# Patient Record
Sex: Female | Born: 1985 | Race: Black or African American | Hispanic: No | Marital: Single | Smoking: Never smoker
Health system: Southern US, Community
[De-identification: ages and names within clinical notes are randomized; demographics above are authoritative.]

## PROBLEM LIST (undated history)

## (undated) ENCOUNTER — Inpatient Hospital Stay (HOSPITAL_COMMUNITY): Payer: Self-pay

## (undated) DIAGNOSIS — O169 Unspecified maternal hypertension, unspecified trimester: Secondary | ICD-10-CM

## (undated) DIAGNOSIS — B019 Varicella without complication: Secondary | ICD-10-CM

## (undated) DIAGNOSIS — R87629 Unspecified abnormal cytological findings in specimens from vagina: Secondary | ICD-10-CM

## (undated) DIAGNOSIS — Z87440 Personal history of urinary (tract) infections: Secondary | ICD-10-CM

## (undated) DIAGNOSIS — N879 Dysplasia of cervix uteri, unspecified: Secondary | ICD-10-CM

## (undated) HISTORY — DX: Unspecified abnormal cytological findings in specimens from vagina: R87.629

## (undated) HISTORY — DX: Dysplasia of cervix uteri, unspecified: N87.9

## (undated) HISTORY — PX: OTHER SURGICAL HISTORY: SHX169

## (undated) HISTORY — DX: Personal history of urinary (tract) infections: Z87.440

## (undated) HISTORY — DX: Unspecified maternal hypertension, unspecified trimester: O16.9

## (undated) HISTORY — DX: Varicella without complication: B01.9

---

## 2011-09-15 DIAGNOSIS — N879 Dysplasia of cervix uteri, unspecified: Secondary | ICD-10-CM

## 2011-09-15 HISTORY — DX: Dysplasia of cervix uteri, unspecified: N87.9

## 2016-11-11 ENCOUNTER — Ambulatory Visit (INDEPENDENT_AMBULATORY_CARE_PROVIDER_SITE_OTHER): Payer: 59 | Admitting: Family Medicine

## 2016-11-11 ENCOUNTER — Encounter: Payer: Self-pay | Admitting: Family Medicine

## 2016-11-11 ENCOUNTER — Ambulatory Visit: Payer: Self-pay | Admitting: Family Medicine

## 2016-11-11 VITALS — BP 112/82 | HR 81 | Temp 98.1°F | Ht 62.0 in | Wt 176.8 lb

## 2016-11-11 DIAGNOSIS — Z131 Encounter for screening for diabetes mellitus: Secondary | ICD-10-CM | POA: Diagnosis not present

## 2016-11-11 DIAGNOSIS — Z86018 Personal history of other benign neoplasm: Secondary | ICD-10-CM

## 2016-11-11 DIAGNOSIS — R8290 Unspecified abnormal findings in urine: Secondary | ICD-10-CM

## 2016-11-11 DIAGNOSIS — N920 Excessive and frequent menstruation with regular cycle: Secondary | ICD-10-CM | POA: Diagnosis not present

## 2016-11-11 DIAGNOSIS — Z862 Personal history of diseases of the blood and blood-forming organs and certain disorders involving the immune mechanism: Secondary | ICD-10-CM

## 2016-11-11 DIAGNOSIS — Z13220 Encounter for screening for lipoid disorders: Secondary | ICD-10-CM

## 2016-11-11 DIAGNOSIS — N3000 Acute cystitis without hematuria: Secondary | ICD-10-CM | POA: Diagnosis not present

## 2016-11-11 DIAGNOSIS — D259 Leiomyoma of uterus, unspecified: Secondary | ICD-10-CM | POA: Insufficient documentation

## 2016-11-11 DIAGNOSIS — Z1329 Encounter for screening for other suspected endocrine disorder: Secondary | ICD-10-CM

## 2016-11-11 LAB — COMPREHENSIVE METABOLIC PANEL
ALT: 8 U/L (ref 0–35)
AST: 14 U/L (ref 0–37)
Albumin: 4.1 g/dL (ref 3.5–5.2)
Alkaline Phosphatase: 69 U/L (ref 39–117)
BUN: 9 mg/dL (ref 6–23)
CHLORIDE: 105 meq/L (ref 96–112)
CO2: 25 mEq/L (ref 19–32)
Calcium: 9 mg/dL (ref 8.4–10.5)
Creatinine, Ser: 0.8 mg/dL (ref 0.40–1.20)
GFR: 109.32 mL/min (ref >60.00)
GLUCOSE: 106 mg/dL — AB (ref 70–99)
POTASSIUM: 3.8 meq/L (ref 3.5–5.1)
SODIUM: 136 meq/L (ref 135–145)
Total Bilirubin: 0.4 mg/dL (ref 0.2–1.2)
Total Protein: 7.9 g/dL (ref 6.0–8.3)

## 2016-11-11 LAB — LIPID PANEL
Cholesterol: 185 mg/dL (ref 0–200)
HDL: 38.2 mg/dL — AB (ref >39.00)
LDL CALC: 129 mg/dL — AB (ref 0–99)
NONHDL: 147.18
Total CHOL/HDL Ratio: 5
Triglycerides: 92 mg/dL (ref 0.0–149.0)
VLDL: 18.4 mg/dL (ref 0.0–40.0)

## 2016-11-11 LAB — HEMOGLOBIN A1C: Hgb A1c MFr Bld: 6.3 % (ref 4.6–6.5)

## 2016-11-11 LAB — CBC
HEMATOCRIT: 34.5 % — AB (ref 36.0–46.0)
Hemoglobin: 10.9 g/dL — ABNORMAL LOW (ref 12.0–15.0)
MCHC: 31.5 g/dL (ref 30.0–36.0)
MCV: 85.9 fl (ref 78.0–100.0)
Platelets: 420 10*3/uL — ABNORMAL HIGH (ref 150.0–400.0)
RBC: 4.01 Mil/uL (ref 3.87–5.11)
RDW: 15 % (ref 11.5–15.5)
WBC: 5.5 10*3/uL (ref 4.0–10.5)

## 2016-11-11 LAB — FERRITIN: Ferritin: 5.3 ng/mL — ABNORMAL LOW (ref 10.0–291.0)

## 2016-11-11 LAB — TSH: TSH: 0.65 u[IU]/mL (ref 0.35–4.50)

## 2016-11-11 NOTE — Patient Instructions (Signed)
It was very nice to meet you today- welcome to [place]!   I am going to set you up with OB-GYN to establish care and further discuss your fibroids. In the meantime continue consistent condom use and remember Plan B is available OTC if needed We will check your labs today - cholesterol, etc- and will check on your iron level.  If your iron level is still low it might be smart to consider a hormonal contraceptive such as the pill, patch, ring, or IUD to decrease your menstrual bleeding I'll be in touch with your labs asap- take care!

## 2016-11-11 NOTE — Progress Notes (Addendum)
[place] at [facility] [address] [place], [place] [address] [phone]  Date:  11/11/2016   Name:  Nicole Kaiser   DOB:  28-Aug-1986   MRN:  [id number]  PCP:  Lamar Blinks, MD    Chief Complaint: Establish Care (Pt here to est. Pt had high risk pregnancy, had son last year in June. Pregnancy was high risk due to fibroids. Pt states that she has not been seen since having baby. Pt was not seen due to moving from [place] to [place]. Pt wonders if fibroids could have possible gotten smaller or if she should checked. Only has lower abd discomfort. c/o recurrent cyst in groin area that is small right now but will sometimes drain pus. )   History of Present Illness:  Lochlyn Kaiser is a 31 y.o. very pleasant female patient who presents with the following:  Here as a new patient today.  No outside records to review as of yet. She has moved here from [place]- just a few months ago. She moved to [place] to be closer to her family  Her first born- a son- was born this past June- he is doing very well  She was dx with fibroids in her uterus on [place] during her pregnancy She did have an emergency C section and her BP was high following delivery.  She thinks that she was dx with pre-eclampsia and had to be kept inpt for several days after devliery. She is no longer on any BP medicattion She is not sure if she will eventually want another baby or not.  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  She does not have any others like this.    She has had frequent UTI in the past, and notes that she tends to think about these sx a lot. She has noted a change in her urine odor recently and would like to make sure she does not have a UTI currently- we will do a urine culture for her  She works in [employer]  Patient Active Problem List   Diagnosis Date Noted  . History of uterine fibroid 11/11/2016  . History of iron deficiency anemia 11/11/2016  . Menorrhagia with regular cycle 11/11/2016    Past Medical History:  Diagnosis Date  . Chicken pox   . History of UTI   . Hypertension affecting pregnancy in first trimester    and second trimester    Past Surgical History:  Procedure Laterality Date  . CESAREAN SECTION  02/14/2016    Social History  Substance Use Topics  . Smoking status: Never Smoker  . Smokeless tobacco: Never Used  . Alcohol use 0.6 oz/week    1 Glasses  of wine per week    Family History  Problem Relation Age of Onset  . Alcoholism Father   . Diabetes Maternal Grandmother     No Known Allergies  Medication list has been reviewed and updated.  No current outpatient prescriptions on file prior to visit.   No current facility-administered medications on file prior to visit.     Review of Systems: Notes that she does tend to crave ice so she suspect low iron level As per HPI- otherwise negative.   Physical Examination: Vitals:   11/11/16 0828  BP: 112/82  Pulse: 81  Temp: 98.1 F (36.7 C)   Vitals:   11/11/16 0828  Weight: 176 lb 12.8 oz (80.2 kg)  Height: 5\' 2"  (1.575 m)   Body mass index is 32.34 kg/m. Ideal Body Weight: Weight in (lb) to have BMI = 25: 136.4  GEN: WDWN, NAD, Non-toxic, A & O x 3, overweight, otherwise  appears very well and healthy  HEENT: Atraumatic, Normocephalic. Neck supple. No masses, No LAD. Bilateral TM wnl, oropharynx normal.  PEERL,EOMI.   Ears and Nose: No external deformity. CV: RRR, No M/G/R. No JVD. No thrill. No extra heart sounds. PULM: CTA B, no wheezes, crackles, rhonchi. No retractions. No resp. distress. No accessory muscle use. ABD: S, NT, ND. No rebound. No HSM. EXTR: No c/c/e NEURO Normal gait.  PSYCH: Normally interactive. Conversant. Not depressed or anxious appearing.  Calm demeanor.  Normally healing lower abd scar from her c section appears to be healing normally The left groin shows a tiny cyst vs hair follicle that is draining whitish fluid.  It is not tender and does not appear to be infected. Likely aseptic - advised pt to monitor and let me know if any changes or concerns about this area    Assessment and Plan:  History of iron deficiency anemia - Plan: CBC, Ferritin  Abnormal urine odor - Plan: Urine Culture  Screening for hyperlipidemia - Plan: Lipid panel  Screening for thyroid disorder - Plan: TSH  Screening for diabetes mellitus - Plan: Comprehensive metabolic panel, Hemoglobin A1c  History of uterine fibroid - Plan: Ambulatory referral to Obstetrics / Gynecology  Menorrhagia with regular cycle - Plan: Ambulatory referral to Obstetrics / Gynecology   Here today to establish care She declines a flu shot today- I did encourage her to do this Referral to [place] pending as above.   Encouraged consistent contraceptive use for her  See patient instructions for more details.   It was very nice to meet you today- welcome to [place]!   I am going to set you up with OB-GYN to establish care and further discuss your fibroids. In the meantime continue consistent condom use and remember Plan B is available OTC if needed We will check your labs today - cholesterol, etc- and will check on your iron level.  If your iron level is still low it might be smart  to consider a hormonal contraceptive such as the pill, patch, ring, or IUD to decrease your menstrual bleeding I'll be in touch with your labs asap- take care!   Signed Lamar Blinks, MD  Received her labs and gave her a call 3/2- she does have a UTI, will call in macrobid for her for treatment.  She states understanding  Letter to pt with other details   Results for orders placed or performed in visit on 11/11/16  Urine Culture  Result Value Ref Range   Culture ESCHERICHIA COLI  Colony Count Greater than 100,000 CFU/mL    Organism ID, Bacteria ESCHERICHIA COLI       Susceptibility   Escherichia coli -  (no method available)    AMPICILLIN >=32 Resistant     AMOX/CLAVULANIC 8 Sensitive     AMPICILLIN/SULBACTAM 16 Intermediate     PIP/TAZO <=4 Sensitive     IMIPENEM <=0.25 Sensitive     CEFAZOLIN <=4 Not Reportable     CEFTRIAXONE <=1 Sensitive     CEFTAZIDIME <=1 Sensitive     CEFEPIME <=1 Sensitive     GENTAMICIN <=1 Sensitive     TOBRAMYCIN <=1 Sensitive     CIPROFLOXACIN >=4 Resistant     LEVOFLOXACIN >=8 Resistant     NITROFURANTOIN <=16 Sensitive     TRIMETH/SULFA* <=20 Sensitive      * NR=NOT REPORTABLE,SEE COMMENTORAL therapy:A cefazolin MIC of <32 predicts susceptibility to the oral agents cefaclor,cefdinir,cefpodoxime,cefprozil,cefuroxime,cephalexin,and loracarbef when used for therapy of uncomplicated UTIs due to E.coli,K.pneumomiae,and P.mirabilis. PARENTERAL therapy: A cefazolinMIC of >8 indicates resistance to parenteralcefazolin. An alternate test method must beperformed to confirm susceptibility to parenteralcefazolin.  CBC  Result Value Ref Range   WBC 5.5 4.0 - 10.5 K/uL   RBC 4.01 3.87 - 5.11 Mil/uL   Platelets 420.0 (H) 150.0 - 400.0 K/uL   Hemoglobin 10.9 (Nicole) 12.0 - 15.0 g/dL   HCT 34.5 (Nicole) 36.0 - 46.0 %   MCV 85.9 78.0 - 100.0 fl   MCHC 31.5 30.0 - 36.0 g/dL   RDW 15.0 11.5 - 15.5 %  Comprehensive metabolic panel  Result Value Ref Range   Sodium  136 135 - 145 mEq/Nicole   Potassium 3.8 3.5 - 5.1 mEq/Nicole   Chloride 105 96 - 112 mEq/Nicole   CO2 25 19 - 32 mEq/Nicole   Glucose, Bld 106 (H) 70 - 99 mg/dL   BUN 9 6 - 23 mg/dL   Creatinine, Ser 0.80 0.40 - 1.20 mg/dL   Total Bilirubin 0.4 0.2 - 1.2 mg/dL   Alkaline Phosphatase 69 39 - 117 U/Nicole   AST 14 0 - 37 U/Nicole   ALT 8 0 - 35 U/Nicole   Total Protein 7.9 6.0 - 8.3 g/dL   Albumin 4.1 3.5 - 5.2 g/dL   Calcium 9.0 8.4 - 10.5 mg/dL   GFR 109.32 >60.00 mL/min  Lipid panel  Result Value Ref Range   Cholesterol 185 0 - 200 mg/dL   Triglycerides 92.0 0.0 - 149.0 mg/dL   HDL 38.20 (Nicole) >39.00 mg/dL   VLDL 18.4 0.0 - 40.0 mg/dL   LDL Cholesterol 129 (H) 0 - 99 mg/dL   Total CHOL/HDL Ratio 5    NonHDL 147.18   TSH  Result Value Ref Range   TSH 0.65 0.35 - 4.50 uIU/mL  Hemoglobin A1c  Result Value Ref Range   Hgb A1c MFr Bld 6.3 4.6 - 6.5 %  Ferritin  Result Value Ref Range   Ferritin 5.3 (Nicole) 10.0 - 291.0 ng/mL

## 2016-11-13 ENCOUNTER — Encounter: Payer: Self-pay | Admitting: Family Medicine

## 2016-11-13 LAB — URINE CULTURE

## 2016-11-13 MED ORDER — NITROFURANTOIN MONOHYD MACRO 100 MG PO CAPS: 100.0000 mg | ORAL_CAPSULE | Freq: Two times a day (BID) | ORAL | 0 refills | Status: DC

## 2016-11-13 MED FILL — NITROFURANTOIN MONO-MCR 100: 100 | 7 days supply | Qty: 14 | Fill #0

## 2016-11-13 NOTE — Addendum Note (Signed)
Addended by: Lamar Blinks C on: 11/13/2016 12:49 PM   Modules accepted: Orders

## 2016-12-09 ENCOUNTER — Encounter: Payer: Self-pay | Admitting: Obstetrics & Gynecology

## 2016-12-09 ENCOUNTER — Ambulatory Visit (INDEPENDENT_AMBULATORY_CARE_PROVIDER_SITE_OTHER): Payer: 59 | Admitting: Obstetrics & Gynecology

## 2016-12-09 VITALS — BP 90/64 | HR 73 | Ht 61.0 in | Wt 176.0 lb

## 2016-12-09 DIAGNOSIS — N760 Acute vaginitis: Secondary | ICD-10-CM

## 2016-12-09 DIAGNOSIS — N920 Excessive and frequent menstruation with regular cycle: Secondary | ICD-10-CM | POA: Diagnosis not present

## 2016-12-09 DIAGNOSIS — B9689 Other specified bacterial agents as the cause of diseases classified elsewhere: Secondary | ICD-10-CM

## 2016-12-09 DIAGNOSIS — Z86018 Personal history of other benign neoplasm: Secondary | ICD-10-CM

## 2016-12-09 DIAGNOSIS — Z862 Personal history of diseases of the blood and blood-forming organs and certain disorders involving the immune mechanism: Secondary | ICD-10-CM | POA: Diagnosis not present

## 2016-12-09 MED ORDER — TRANEXAMIC ACID 650 MG PO TABS: 1300.0000 mg | ORAL_TABLET | Freq: Three times a day (TID) | ORAL | 2 refills | Status: DC

## 2016-12-09 MED ORDER — NAPROXEN 500 MG PO TBEC: 500.0000 mg | DELAYED_RELEASE_TABLET | Freq: Two times a day (BID) | ORAL | 5 refills | Status: DC

## 2016-12-09 MED ORDER — IRON 325 (65 FE) MG PO TABS: 1.0000 | ORAL_TABLET | Freq: Two times a day (BID) | ORAL | 7 refills | Status: DC

## 2016-12-09 NOTE — Progress Notes (Signed)
GYNECOLOGY OFFICE VISIT NOTE  History:  31 y.o. G1P1 here today for evaluation and discussion of menorrhagia and fibroids.  Reports normal menstrual periods until recent pregnancy. During pregnancy, she was told she had three fibroids.  After delivery in June 2017, her periods resumed and became heavier than usual. Periods are regular, last for 5 days, but the first two days are characterized by heavy bleeding needing pad changes every three hours. No associated cramping.  Denies symptoms of anemia but has been diagnosed with iron deficiency anemia for which she takes oral iron therapy.  She established care with Dr. Edilia Bo (PCP) on 11/11/16 and was referred here for further evaluation . She denies any current abnormal vaginal discharge, pelvic pain or other concerns.   Past Medical History:  Diagnosis Date  . Cervical dysplasia 2013   Normal follow up paps  . Chicken pox   . History of UTI   . Hypertension affecting pregnancy     Past Surgical History:  Procedure Laterality Date  . CESAREAN SECTION  02/14/2016    The following portions of the patient's history were reviewed and updated as appropriate: allergies, current medications, past family history, past medical history, past social history, past surgical history and problem list.   Health Maintenance:  Normal pap in 2016 in [place].   Review of Systems:  Pertinent items noted in HPI and remainder of comprehensive ROS otherwise negative.   Objective:  Physical Exam BP 90/64 (BP Location: Left Arm)   Pulse 73   Ht 5\' 1"  (1.549 m)   Wt 176 lb (79.8 kg)   LMP 11/30/2016 (Exact Date)   BMI 33.25 kg/m  CONSTITUTIONAL: Well-developed, well-nourished female in no acute distress.  HENT:  Normocephalic, atraumatic. External right and left ear normal. Oropharynx is clear and moist EYES: Conjunctivae and EOM are normal. Pupils are equal, round, and reactive to light. No scleral icterus.  NECK: Normal range of motion, supple, no  masses SKIN: Skin is warm and dry. No rash noted. Not diaphoretic. No erythema. No pallor. NEUROLOGIC: Alert and oriented to person, place, and time. Normal reflexes, muscle tone coordination. No cranial nerve deficit noted. PSYCHIATRIC: Normal mood and affect. Normal behavior. Normal judgment and thought content. CARDIOVASCULAR: Normal heart rate noted RESPIRATORY: Effort and breath sounds normal, no problems with respiration noted ABDOMEN: Soft, no distention noted.   PELVIC: Normal appearing external genitalia; normal appearing distal vaginal mucosa. No abnormal discharge noted; testing sample obtained.  14 week sized enlarged fibroid uterus noted, posterior fibroid palpated during examination.  No other palpable masses, no uterine or adnexal tenderness. MUSCULOSKELETAL: Normal range of motion. No edema noted.  Labs and Imaging No results found.  Assessment & Plan:  1. Menorrhagia with regular cycle Ultrasound ordered for further characterization of fibroid uterus.  Infection screening done.  Discussed fertility sparing management options for abnormal uterine bleeding including NSAIDs, tranexamic acid (Lysteda), oral progesterone (Megace), Depo Provera and progestin IUD.  Discussed risks and benefits of each method.   Patient desires to avoid hormonal therapy for now, wants to try NSAIDs and Lysteda.  Printed patient education handouts were given to the patient to review at home.  Lysteda and Naproxen prescribed as needed for now,  bleeding precautions reviewed.  - US Pelvis Complete; Future - US Transvaginal Non-OB; Future - Cervicovaginal ancillary only - naproxen (EC NAPROSYN) 500 MG EC tablet; Take 1 tablet (500 mg total) by mouth 2 (two) times daily with a meal. Take during periods as instructed for  five days  Dispense: 30 tablet; Refill: 5 - tranexamic acid (LYSTEDA) 650 MG TABS tablet; Take 2 tablets (1,300 mg total) by mouth 3 (three) times daily. Take during menses for a maximum of  five days  Dispense: 30 tablet; Refill: 2  2. History of uterine fibroid Will follow up scan - US Pelvis Complete; Future - US Transvaginal Non-OB; Future  3. History of iron deficiency anemia Iron therapy refilled - Ferrous Sulfate (IRON) 325 (65 Fe) MG TABS; Take 1 tablet by mouth 2 (two) times daily.  Dispense: 60 each; Refill: 7   Routine preventative health maintenance measures emphasized. Please refer to After Visit Summary for other counseling recommendations.   Return if symptoms worsen or fail to improve.   Total face-to-face time with patient: 20 minutes. Over 50% of encounter was spent on counseling and coordination of care.   [name], MD, [place] Attending [place], [facility] for [employer], [place]

## 2016-12-09 NOTE — Patient Instructions (Signed)

## 2016-12-10 LAB — CERVICOVAGINAL ANCILLARY ONLY
BACTERIAL VAGINITIS: POSITIVE — AB
CANDIDA VAGINITIS: NEGATIVE
CHLAMYDIA, DNA PROBE: NEGATIVE
NEISSERIA GONORRHEA: NEGATIVE
TRICH (WINDOWPATH): NEGATIVE

## 2016-12-11 MED ORDER — METRONIDAZOLE 500 MG PO TABS: 500.0000 mg | ORAL_TABLET | Freq: Two times a day (BID) | ORAL | 0 refills | Status: DC

## 2016-12-11 NOTE — Addendum Note (Signed)
Addended by: [name] A on: 12/11/2016 09:20 AM   Modules accepted: Orders

## 2016-12-19 ENCOUNTER — Ambulatory Visit (HOSPITAL_BASED_OUTPATIENT_CLINIC_OR_DEPARTMENT_OTHER)
Admission: RE | Admit: 2016-12-19 | Discharge: 2016-12-19 | Disposition: A | Discharge status: Home / Self Care | Payer: 59 | Source: Ambulatory Visit | Attending: Obstetrics & Gynecology | Admitting: Obstetrics & Gynecology

## 2016-12-19 ENCOUNTER — Ambulatory Visit (HOSPITAL_BASED_OUTPATIENT_CLINIC_OR_DEPARTMENT_OTHER): Payer: 59

## 2016-12-19 DIAGNOSIS — N83202 Unspecified ovarian cyst, left side: Secondary | ICD-10-CM | POA: Insufficient documentation

## 2016-12-19 DIAGNOSIS — N920 Excessive and frequent menstruation with regular cycle: Secondary | ICD-10-CM

## 2016-12-19 DIAGNOSIS — Z86018 Personal history of other benign neoplasm: Secondary | ICD-10-CM

## 2016-12-19 DIAGNOSIS — D259 Leiomyoma of uterus, unspecified: Secondary | ICD-10-CM | POA: Insufficient documentation

## 2016-12-31 MED FILL — FERROUS SULFATE 325 MG TAB: 325 (65 FE) | 100 days supply | Qty: 100 | Fill #0

## 2016-12-31 MED FILL — NAPROXEN DR 500 MG TABLET: 500 | 15 days supply | Qty: 30 | Fill #0

## 2016-12-31 MED FILL — metroNIDAZOLE 500 MG TABS: 500 | 7 days supply | Qty: 14 | Fill #0

## 2016-12-31 MED FILL — TRANEXAMIC ACID 650 MG TAB: 650 | 5 days supply | Qty: 30 | Fill #0

## 2017-01-28 ENCOUNTER — Encounter: Payer: Self-pay | Admitting: Family Medicine

## 2017-01-28 ENCOUNTER — Ambulatory Visit (INDEPENDENT_AMBULATORY_CARE_PROVIDER_SITE_OTHER): Payer: 59 | Admitting: Family Medicine

## 2017-01-28 VITALS — BP 116/64 | HR 66 | Wt 174.0 lb

## 2017-01-28 DIAGNOSIS — L298 Other pruritus: Secondary | ICD-10-CM

## 2017-01-28 DIAGNOSIS — B373 Candidiasis of vulva and vagina: Secondary | ICD-10-CM

## 2017-01-28 DIAGNOSIS — B3731 Acute candidiasis of vulva and vagina: Secondary | ICD-10-CM

## 2017-01-28 DIAGNOSIS — N898 Other specified noninflammatory disorders of vagina: Secondary | ICD-10-CM

## 2017-01-28 MED ORDER — FLUCONAZOLE 150 MG PO TABS: 150.0000 mg | ORAL_TABLET | Freq: Once | ORAL | 1 refills | Status: AC

## 2017-01-28 MED FILL — FLUCONAZOLE 150 MG TABLET: 150 | 3 days supply | Qty: 1 | Fill #0

## 2017-01-28 NOTE — Patient Instructions (Signed)

## 2017-01-28 NOTE — Addendum Note (Signed)
Addended by: Phill Myron on: 01/28/2017 08:48 AM   Modules accepted: Orders

## 2017-01-28 NOTE — Progress Notes (Signed)
   Subjective:    Patient ID: Nicole Kaiser, female    DOB: May 23, 1986, 31 y.o.   MRN: [id number]  HPI Patient Seen for vaginal itching that started 4 days ago after finishing an antibiotic for bacterial vaginosis. Patient has not had a lot of recurrent infections. Symptoms are getting worse. She has tried Monistat in the past, which is not helpful for symptoms.   Review of Systems     Objective:   Physical Exam  Constitutional: She appears well-developed and well-nourished.  Cardiovascular: Normal rate and regular rhythm.   Pulmonary/Chest: Effort normal and breath sounds normal.  Abdominal: Soft. She exhibits no distension. There is no tenderness. There is no rebound and no guarding.  Genitourinary: There is no rash, tenderness or lesion on the right labia. There is no rash, tenderness or lesion on the left labia.  Skin: Skin is warm and dry.  Psychiatric: She has a normal mood and affect. Her behavior is normal. Judgment and thought content normal.      Assessment & Plan:  1. Yeast vaginitis Diflucan. May repeat in 3 days if still symptomatic. Discussed probiotics. Discussed prophylactic treatment with Flagyl after antibiotics.

## 2017-01-28 NOTE — Progress Notes (Signed)
Patient is having a lot of vaginal itching. No discharge. Patient states she just finished a round of flagyl. Kathrene Alu RNBSN

## 2017-02-01 ENCOUNTER — Other Ambulatory Visit: Payer: Self-pay | Admitting: Family Medicine

## 2017-02-01 ENCOUNTER — Encounter: Payer: Self-pay | Admitting: Family Medicine

## 2017-02-01 LAB — CERVICOVAGINAL ANCILLARY ONLY
Bacterial vaginitis: POSITIVE — AB
Candida vaginitis: NEGATIVE

## 2017-02-01 MED ORDER — METRONIDAZOLE 500 MG PO TABS: 500.0000 mg | ORAL_TABLET | Freq: Two times a day (BID) | ORAL | 0 refills | Status: DC

## 2017-02-01 MED FILL — metroNIDAZOLE 500 MG TABS: 500 | 7 days supply | Qty: 14 | Fill #0

## 2017-06-18 ENCOUNTER — Other Ambulatory Visit (HOSPITAL_COMMUNITY)
Admission: RE | Admit: 2017-06-18 | Discharge: 2017-06-18 | Disposition: A | Discharge status: Home / Self Care | Payer: BLUE CROSS/BLUE SHIELD | Source: Ambulatory Visit | Attending: Family Medicine | Admitting: Family Medicine

## 2017-06-18 ENCOUNTER — Ambulatory Visit (INDEPENDENT_AMBULATORY_CARE_PROVIDER_SITE_OTHER): Payer: BLUE CROSS/BLUE SHIELD | Admitting: Family Medicine

## 2017-06-18 ENCOUNTER — Encounter: Payer: Self-pay | Admitting: Family Medicine

## 2017-06-18 VITALS — BP 131/69 | HR 76 | Ht 61.0 in | Wt 168.0 lb

## 2017-06-18 DIAGNOSIS — Z87440 Personal history of urinary (tract) infections: Secondary | ICD-10-CM | POA: Insufficient documentation

## 2017-06-18 DIAGNOSIS — Z1389 Encounter for screening for other disorder: Secondary | ICD-10-CM | POA: Diagnosis not present

## 2017-06-18 DIAGNOSIS — Z01419 Encounter for gynecological examination (general) (routine) without abnormal findings: Secondary | ICD-10-CM | POA: Diagnosis not present

## 2017-06-18 DIAGNOSIS — N920 Excessive and frequent menstruation with regular cycle: Secondary | ICD-10-CM

## 2017-06-18 MED ORDER — TRANEXAMIC ACID 650 MG PO TABS: 1300.0000 mg | ORAL_TABLET | Freq: Three times a day (TID) | ORAL | 3 refills | Status: DC

## 2017-06-18 NOTE — Progress Notes (Signed)
GYNECOLOGY ANNUAL PREVENTATIVE CARE ENCOUNTER NOTE  Subjective:   Nicole Kaiser is a 31 y.o. G1P1 female here for a routine annual gynecologic exam.  Current complaints: vaginal discharge and odor.   Denies abnormal vaginal bleeding, pelvic pain, problems with intercourse or other gynecologic concerns.    Takes lysteda with menses, which is helpful  Gynecologic History Patient's last menstrual period was 05/24/2017 (within weeks). Patient is sexually active  Contraception: condoms Last Pap: 3 years ago. Results were: normal Last mammogram: n/a.  Obstetric History OB History  Gravida Para Term Preterm AB Living  1 1          SAB TAB Ectopic Multiple Live Births          1    # Outcome Date GA Lbr Len/2nd Weight Sex Delivery Anes PTL Lv  1 Para    5 lb 2 oz (2.325 kg) M CS-Unspec         Past Medical History:  Diagnosis Date  . Cervical dysplasia 2013   Normal follow up paps  . Chicken pox   . History of UTI   . Hypertension affecting pregnancy   . Vaginal Pap smear, abnormal     Past Surgical History:  Procedure Laterality Date  . CESAREAN SECTION  02/14/2016  . colposopy      No current outpatient prescriptions on file prior to visit.   No current facility-administered medications on file prior to visit.     No Known Allergies  Social History   Social History  . Marital status: Single    Spouse name: N/A  . Number of children: N/A  . Years of education: N/A   Occupational History  . Not on file.   Social History Main Topics  . Smoking status: Never Smoker  . Smokeless tobacco: Never Used  . Alcohol use 0.6 oz/week    1 Glasses of wine per week  . Drug use: No  . Sexual activity: Yes    Birth control/ protection: Condom   Other Topics Concern  . Not on file   Social History Narrative  . No narrative on file    Family History  Problem Relation Age of Onset  . Alcoholism Father   . Diabetes Maternal Grandmother   . Cancer Neg Hx   .  Hypertension Neg Hx   . Stroke Neg Hx     The following portions of the patient's history were reviewed and updated as appropriate: allergies, current medications, past family history, past medical history, past social history, past surgical history and problem list.  Review of Systems Pertinent items are noted in HPI.   Objective:  BP 131/69 (BP Location: Left Arm)   Pulse 76   Ht 5\' 1"  (1.549 m)   Wt 168 lb (76.2 kg)   LMP 05/24/2017 (Within Weeks)   BMI 31.74 kg/m  CONSTITUTIONAL: Well-developed, well-nourished female in no acute distress.  HENT:  Normocephalic, atraumatic, External right and left ear normal. Oropharynx is clear and moist EYES: Conjunctivae and EOM are normal. Pupils are equal, round, and reactive to light. No scleral icterus.  NECK: Normal range of motion, supple, no masses.  Normal thyroid.   CARDIOVASCULAR: Normal heart rate noted, regular rhythm RESPIRATORY: Clear to auscultation bilaterally. Effort and breath sounds normal, no problems with respiration noted. BREASTS: Symmetric in size. No masses, skin changes, nipple drainage, or lymphadenopathy. ABDOMEN: Soft, normal bowel sounds, no distention noted.  No tenderness, rebound or guarding.  PELVIC: Normal appearing external  genitalia; normal appearing vaginal mucosa and cervix.  No abnormal discharge noted.  Pap smear obtained.  Normal uterine size, no other palpable masses, no uterine or adnexal tenderness. MUSCULOSKELETAL: Normal range of motion. No tenderness.  No cyanosis, clubbing, or edema.  2+ distal pulses. SKIN: Skin is warm and dry. No rash noted. Not diaphoretic. No erythema. No pallor. NEUROLOGIC: Alert and oriented to person, place, and time. Normal reflexes, muscle tone coordination. No cranial nerve deficit noted. PSYCHIATRIC: Normal mood and affect. Normal behavior. Normal judgment and thought content.  Assessment:  Annual gynecologic examination with pap smear   Plan:  Will follow up  results of pap smear and manage accordingly. Wet prep on PAP STD testing discussed. Patient declined testing Discussed exercise and diet Calcium and Vitamin D supplementation discussed  Routine preventative health maintenance measures emphasized. Please refer to After Visit Summary for other counseling recommendations.    [place], [place] for [employer]

## 2017-06-18 NOTE — Patient Instructions (Signed)

## 2017-06-21 LAB — CYTOLOGY - PAP
Bacterial vaginitis: POSITIVE — AB
Candida vaginitis: NEGATIVE
Diagnosis: NEGATIVE

## 2017-06-22 ENCOUNTER — Other Ambulatory Visit: Payer: Self-pay | Admitting: Family Medicine

## 2017-06-22 ENCOUNTER — Encounter: Payer: Self-pay | Admitting: Family Medicine

## 2017-06-22 ENCOUNTER — Telehealth: Payer: Self-pay

## 2017-06-22 MED ORDER — METRONIDAZOLE 0.75 % VA GEL: 1.0000 | Freq: Every day | VAGINAL | 1 refills | Status: DC

## 2017-06-22 MED ORDER — METRONIDAZOLE 500 MG PO TABS: 500.0000 mg | ORAL_TABLET | Freq: Two times a day (BID) | ORAL | 0 refills | Status: DC

## 2017-06-22 MED FILL — metroNIDAZOLE 0.75 % GEL: 0.75 | 5 days supply | Qty: 70 | Fill #0

## 2017-06-22 NOTE — Telephone Encounter (Signed)
done

## 2017-06-22 NOTE — Telephone Encounter (Signed)
Patient made aware that Nehemiah Settle has sent in her Flagyl to the [facility].  Patient asking if she could have a cream instead. Will route to provider for approval. Kathrene Alu RNBSN

## 2017-08-25 ENCOUNTER — Telehealth: Payer: Self-pay | Admitting: *Deleted

## 2017-08-25 DIAGNOSIS — N920 Excessive and frequent menstruation with regular cycle: Secondary | ICD-10-CM

## 2017-08-25 MED ORDER — NAPROXEN 500 MG PO TABS: 500.0000 mg | ORAL_TABLET | Freq: Two times a day (BID) | ORAL | 2 refills | Status: DC

## 2017-08-25 MED ORDER — TRANEXAMIC ACID 650 MG PO TABS: 1300.0000 mg | ORAL_TABLET | Freq: Three times a day (TID) | ORAL | 3 refills | Status: DC

## 2017-08-25 MED FILL — TRANEXAMIC ACID 650 MG TAB: 650 | 5 days supply | Qty: 30 | Fill #0

## 2017-08-25 MED FILL — NAPROXEN 500 MG TABLET: 500 | 30 days supply | Qty: 60 | Fill #0

## 2017-08-25 NOTE — Telephone Encounter (Signed)
Pt made aware

## 2017-08-25 NOTE — Telephone Encounter (Signed)
Prescriptions sent. Has refills.

## 2017-08-25 NOTE — Telephone Encounter (Signed)
Pt called into office for refills on Fibroid and pain medication. Pt has Lysteda and Naproxen noted on her Med list. Pt states that she did not pick up Rx when last sent in October.  Pt made aware message to be sent to  provider for approval on refills. Pt would like Rx's to be sent to [facility].  Please advise on refill approval.

## 2017-08-30 ENCOUNTER — Encounter: Payer: Self-pay | Admitting: Medical

## 2017-08-30 ENCOUNTER — Ambulatory Visit: Payer: BLUE CROSS/BLUE SHIELD | Admitting: Medical

## 2017-08-30 VITALS — BP 111/67 | HR 68 | Temp 98.3°F | Resp 16 | Ht 61.0 in | Wt 171.0 lb

## 2017-08-30 DIAGNOSIS — J069 Acute upper respiratory infection, unspecified: Secondary | ICD-10-CM

## 2017-08-30 DIAGNOSIS — R059 Cough, unspecified: Secondary | ICD-10-CM

## 2017-08-30 DIAGNOSIS — R0982 Postnasal drip: Secondary | ICD-10-CM | POA: Diagnosis not present

## 2017-08-30 DIAGNOSIS — R05 Cough: Secondary | ICD-10-CM | POA: Diagnosis not present

## 2017-08-30 MED ORDER — FLUTICASONE PROPIONATE 50 MCG/ACT NA SUSP: 2.0000 | Freq: Every day | NASAL | 1 refills | Status: DC

## 2017-08-30 MED ORDER — AZITHROMYCIN 250 MG PO TABS: ORAL_TABLET | ORAL | 0 refills | Status: DC

## 2017-08-30 MED ORDER — HYDROCODONE-HOMATROPINE 5-1.5 MG/5ML PO SYRP: 5.0000 mL | ORAL_SOLUTION | Freq: Three times a day (TID) | ORAL | 0 refills | Status: DC | PRN

## 2017-08-30 MED FILL — FLUTICASONE PROP 50 MCG SPR: 50 | 30 days supply | Qty: 16 | Fill #0

## 2017-08-30 MED FILL — HYDROCODONE-HOMATROPINE SOL: 5-1.5 | 7 days supply | Qty: 100 | Fill #0

## 2017-08-30 NOTE — Patient Instructions (Signed)
You appear to have uri symptoms with post nasal drainage and severe cough associated.   I will prescribe hycodan cough med and flonase nasal congestion.   If you develop sinus pain or bronchitis symptoms then start azithromycin antibiotic. Print prescription given.  If any wheezing associated with cough let us know.   Follow up in 7-10 days or as needed

## 2017-08-30 NOTE — Progress Notes (Signed)
Subjective:    Patient ID: Nicole Kaiser, female    DOB: Aug 28, 1986, 31 y.o.   MRN: [id number]  HPI   Pt in with cough recently that has been severe. She states some nasal congestion, runny nose and some pnd. Symptoms started saturday morning. Cough started last night. She states cough is severe and gets into hacking episodes that will cause her to dry heave but if eaten recently will vomit.   She mentions also about 10 days ago had uri type symptoms then got better for 3 days then sick again quickly.  Also states some coughing up mucous.  Pt son sick with mild uri symptoms presently.   LMP- 3 weeks. She states she is not late.  No wheezing reported.   Review of Systems  Constitutional: Negative for chills, fatigue and fever.  HENT: Positive for congestion. Negative for postnasal drip, sinus pressure, sinus pain and sore throat.   Eyes: Negative for pain and visual disturbance.  Respiratory: Positive for cough. Negative for chest tightness, shortness of breath and wheezing.   Cardiovascular: Negative for chest pain and palpitations.  Gastrointestinal: Negative for abdominal pain, blood in stool, diarrhea, nausea and vomiting.       Sometimes dry heaves with excessive coughing.  Even vomits if cough episode occurs after eating.  Genitourinary: Negative for dysuria, flank pain, frequency, hematuria, urgency and vaginal pain.  Musculoskeletal: Negative for back pain, joint swelling, myalgias and neck pain.  Skin: Negative for rash.  Neurological: Negative for dizziness, numbness and headaches.  Hematological: Negative for adenopathy. Does not bruise/bleed easily.  Psychiatric/Behavioral: Negative for behavioral problems, confusion, self-injury and suicidal ideas. The patient is not nervous/anxious.      Past Medical History:  Diagnosis Date  . Cervical dysplasia 2013   Normal follow up paps  . Chicken pox   . History of UTI   . Hypertension affecting pregnancy   . Vaginal Pap  smear, abnormal      Social History   Socioeconomic History  . Marital status: Single    Spouse name: Not on file  . Number of children: Not on file  . Years of education: Not on file  . Highest education level: Not on file  Social Needs  . Financial resource strain: Not on file  . Food insecurity - worry: Not on file  . Food insecurity - inability: Not on file  . Transportation needs - medical: Not on file  . Transportation needs - non-medical: Not on file  Occupational History  . Not on file  Tobacco Use  . Smoking status: Never Smoker  . Smokeless tobacco: Never Used  Substance and Sexual Activity  . Alcohol use: Yes    Alcohol/week: 0.6 oz    Types: 1 Glasses of wine per week  . Drug use: No  . Sexual activity: Yes    Birth control/protection: Condom  Other Topics Concern  . Not on file  Social History Narrative  . Not on file    Past Surgical History:  Procedure Laterality Date  . CESAREAN SECTION  02/14/2016  . colposopy      Family History  Problem Relation Age of Onset  . Alcoholism Father   . Diabetes Maternal Grandmother   . Cancer Neg Hx   . Hypertension Neg Hx   . Stroke Neg Hx     No Known Allergies  Current Outpatient Medications on File Prior to Visit  Medication Sig Dispense Refill  . metroNIDAZOLE (METROGEL) 0.75 % vaginal gel  Place 1 Applicatorful vaginally at bedtime. Apply one applicatorful to vagina at bedtime for 5 days 70 g 1  . naproxen (NAPROSYN) 500 MG tablet Take 1 tablet (500 mg total) by mouth 2 (two) times daily with a meal. As needed for pain 60 tablet 2  . tranexamic acid (LYSTEDA) 650 MG TABS tablet Take 2 tablets (1,300 mg total) by mouth 3 (three) times daily. Take during menses for a maximum of five days 30 tablet 3   No current facility-administered medications on file prior to visit.     BP 111/67   Pulse 68   Temp 98.3 F (36.8 C) (Oral)   Resp 16   Ht 5\' 1"  (1.549 m)   Wt 171 lb (77.6 kg)   SpO2 99%   BMI  32.31 kg/m       Objective:   Physical Exam  General  Mental Status - Alert. General Appearance - Well groomed. Not in acute distress.  Skin Rashes- No Rashes.  HEENT Head- Normal. Ear Auditory Canal - Left- Normal. Right - Normal.Tympanic Membrane- Left- Normal. Right- Normal. Eye Sclera/Conjunctiva- Left- Normal. Right- Normal. Nose & Sinuses Nasal Mucosa- Left-  Boggy and Congested. Right-  Boggy and  Congested.Bilateral  No maxillary and  No frontal sinus pressure. Mouth & Throat Lips: Upper Lip- Normal: no dryness, cracking, pallor, cyanosis, or vesicular eruption. Lower Lip-Normal: no dryness, cracking, pallor, cyanosis or vesicular eruption. Buccal Mucosa- Bilateral- No Aphthous ulcers. Oropharynx- No Discharge or Erythema. +pnd. Tonsils: Characteristics- Bilateral- No Erythema or Congestion. Size/Enlargement- Bilateral- No enlargement. Discharge- bilateral-None.  Neck Neck- Supple. No Masses.   Chest and Lung Exam Auscultation: Breath Sounds:-Clear even and unlabored.  Cardiovascular Auscultation:Rythm- Regular, rate and rhythm. Murmurs & Other Heart Sounds:Ausculatation of the heart reveal- No Murmurs.  Lymphatic Head & Neck General Head & Neck Lymphatics: Bilateral: Description- No Localized lymphadenopathy.       Assessment & Plan:  You appear to have uri symptoms with pnd and severe cough associated.   I will prescribe hycodan cough med and flonase nasal congestion.   If you develop sinus pain or bronchitis symptoms then start azithromycin antibiotic. Print prescription given.  If any wheezing associated with cough let us know.   Follow up in 7-10 days or as needed  Malori Myers, Percell Miller, [employer]

## 2018-04-21 ENCOUNTER — Ambulatory Visit: Payer: BLUE CROSS/BLUE SHIELD | Admitting: Family Medicine

## 2018-08-04 ENCOUNTER — Ambulatory Visit: Payer: BLUE CROSS/BLUE SHIELD | Admitting: Family Medicine

## 2019-08-31 ENCOUNTER — Ambulatory Visit: Payer: Self-pay | Admitting: Obstetrics & Gynecology

## 2019-09-04 ENCOUNTER — Ambulatory Visit: Payer: Self-pay | Admitting: Family Medicine

## 2019-10-12 ENCOUNTER — Ambulatory Visit: Payer: Self-pay | Admitting: Family Medicine

## 2019-10-12 DIAGNOSIS — Z01419 Encounter for gynecological examination (general) (routine) without abnormal findings: Secondary | ICD-10-CM

## 2019-11-28 ENCOUNTER — Ambulatory Visit: Payer: Self-pay | Admitting: Medical

## 2019-12-13 ENCOUNTER — Other Ambulatory Visit: Payer: Self-pay

## 2019-12-13 ENCOUNTER — Emergency Department (HOSPITAL_BASED_OUTPATIENT_CLINIC_OR_DEPARTMENT_OTHER): Payer: Self-pay

## 2019-12-13 ENCOUNTER — Encounter (HOSPITAL_BASED_OUTPATIENT_CLINIC_OR_DEPARTMENT_OTHER): Payer: Self-pay | Admitting: Emergency Medicine

## 2019-12-13 ENCOUNTER — Emergency Department (HOSPITAL_BASED_OUTPATIENT_CLINIC_OR_DEPARTMENT_OTHER)
Admission: EM | Admit: 2019-12-13 | Discharge: 2019-12-13 | Disposition: A | Discharge status: Home / Self Care | Payer: Self-pay | Attending: Emergency Medicine | Admitting: Emergency Medicine

## 2019-12-13 DIAGNOSIS — N760 Acute vaginitis: Secondary | ICD-10-CM | POA: Insufficient documentation

## 2019-12-13 DIAGNOSIS — D219 Benign neoplasm of connective and other soft tissue, unspecified: Secondary | ICD-10-CM | POA: Insufficient documentation

## 2019-12-13 DIAGNOSIS — N898 Other specified noninflammatory disorders of vagina: Secondary | ICD-10-CM | POA: Insufficient documentation

## 2019-12-13 DIAGNOSIS — R102 Pelvic and perineal pain: Secondary | ICD-10-CM

## 2019-12-13 DIAGNOSIS — B9689 Other specified bacterial agents as the cause of diseases classified elsewhere: Secondary | ICD-10-CM | POA: Insufficient documentation

## 2019-12-13 LAB — URINALYSIS, ROUTINE W REFLEX MICROSCOPIC
Bilirubin Urine: NEGATIVE
Glucose, UA: NEGATIVE mg/dL
Hgb urine dipstick: NEGATIVE
Ketones, ur: NEGATIVE mg/dL
Leukocytes,Ua: NEGATIVE
Nitrite: NEGATIVE
Protein, ur: NEGATIVE mg/dL
Specific Gravity, Urine: 1.015 (ref 1.005–1.030)
pH: 7 (ref 5.0–8.0)

## 2019-12-13 LAB — WET PREP, GENITAL
Sperm: NONE SEEN
Trich, Wet Prep: NONE SEEN
Yeast Wet Prep HPF POC: NONE SEEN

## 2019-12-13 LAB — HIV ANTIBODY (ROUTINE TESTING W REFLEX): HIV Screen 4th Generation wRfx: NONREACTIVE

## 2019-12-13 LAB — PREGNANCY, URINE: Preg Test, Ur: NEGATIVE

## 2019-12-13 MED ORDER — METRONIDAZOLE 500 MG PO TABS: 500.0000 mg | ORAL_TABLET | Freq: Two times a day (BID) | ORAL | 0 refills | Status: DC

## 2019-12-13 MED ORDER — NAPROXEN 500 MG PO TABS: 500.0000 mg | ORAL_TABLET | Freq: Two times a day (BID) | ORAL | 0 refills | Status: DC

## 2019-12-13 NOTE — ED Provider Notes (Signed)
[facility] EMERGENCY DEPARTMENT Provider Note   CSN: [id number] Arrival date & time: 12/13/19  1856     History Chief Complaint  Patient presents with  . Abdominal Pain    Nicole Kaiser is a 34 y.o. female with a history of uterine fibroids, abnormal pap smears, and hypertension who presents to the ED with complaints of abdominal pain & vaginal discharge that began today. Patient states pain is located in the suprapubic area, constant pressure, currently an 8/10 in severity, no alleviating/aggravating factors. Associated with white vaginal discharge which she noted today, non pruritic. LMP 12/03/19. She is currently sexually active in a monogamous relationship, does not use protection, no known STD exposure. Denies fever, chills, N/V, dysuria, frequency, vaginal bleeding, diarrhea, chest pain, or dyspnea.   HPI     Past Medical History:  Diagnosis Date  . Cervical dysplasia 2013   Normal follow up paps  . Chicken pox   . History of UTI   . Hypertension affecting pregnancy   . Vaginal Pap smear, abnormal     Patient Active Problem List   Diagnosis Date Noted  . History of uterine fibroid 11/11/2016  . History of iron deficiency anemia 11/11/2016  . Menorrhagia with regular cycle 11/11/2016    Past Surgical History:  Procedure Laterality Date  . CESAREAN SECTION  02/14/2016  . colposopy       OB History    Gravida  1   Para  1   Term      Preterm      AB      Living        SAB      TAB      Ectopic      Multiple      Live Births  1           Family History  Problem Relation Age of Onset  . Alcoholism Father   . Diabetes Maternal Grandmother   . Cancer Neg Hx   . Hypertension Neg Hx   . Stroke Neg Hx     Social History   Tobacco Use  . Smoking status: Never Smoker  . Smokeless tobacco: Never Used  Substance Use Topics  . Alcohol use: Yes    Alcohol/week: 1.0 standard drinks    Types: 1 Glasses of wine per week  . Drug  use: No    Home Medications Prior to Admission medications   Medication Sig Start Date End Date Taking? Authorizing Provider  azithromycin (ZITHROMAX) 250 MG tablet Take 2 tablets by mouth on day 1, followed by 1 tablet by mouth daily for 4 days. 08/30/17   Saguier, Percell Miller, PA-C  fluticasone (FLONASE) 50 MCG/ACT nasal spray Place 2 sprays into both nostrils daily. 08/30/17   Saguier, Percell Miller, PA-C  HYDROcodone-homatropine (HYCODAN) 5-1.5 MG/5ML syrup Take 5 mLs by mouth every 8 (eight) hours as needed for cough. 08/30/17   Saguier, Percell Miller, PA-C  metroNIDAZOLE (METROGEL) 0.75 % vaginal gel Place 1 Applicatorful vaginally at bedtime. Apply one applicatorful to vagina at bedtime for 5 days 06/22/17   Truett Mainland, DO  naproxen (NAPROSYN) 500 MG tablet Take 1 tablet (500 mg total) by mouth 2 (two) times daily with a meal. As needed for pain 08/25/17   Truett Mainland, DO  tranexamic acid (LYSTEDA) 650 MG TABS tablet Take 2 tablets (1,300 mg total) by mouth 3 (three) times daily. Take during menses for a maximum of five days 08/25/17   Truett Mainland,  DO    Allergies    Patient has no known allergies.  Review of Systems   Review of Systems  Constitutional: Negative for chills and fever.  Respiratory: Negative for shortness of breath.   Cardiovascular: Negative for chest pain.  Gastrointestinal: Positive for abdominal pain. Negative for blood in stool, constipation, diarrhea, nausea and vomiting.  Genitourinary: Positive for vaginal discharge. Negative for dysuria, frequency and vaginal bleeding.  Neurological: Negative for syncope.  All other systems reviewed and are negative.   Physical Exam Updated Vital Signs BP 127/77   Pulse 61   Temp 98.2 F (36.8 C)   Resp 18   LMP 12/03/2019 (Exact Date)   SpO2 100%   Physical Exam Vitals and nursing note reviewed.  Constitutional:      General: She is not in acute distress.    Appearance: She is well-developed. She is not  toxic-appearing.  HENT:     Head: Normocephalic and atraumatic.  Eyes:     General:        Right eye: No discharge.        Left eye: No discharge.     Conjunctiva/sclera: Conjunctivae normal.  Cardiovascular:     Rate and Rhythm: Normal rate and regular rhythm.  Pulmonary:     Effort: Pulmonary effort is normal. No respiratory distress.     Breath sounds: Normal breath sounds. No wheezing, rhonchi or rales.  Abdominal:     General: There is no distension.     Palpations: Abdomen is soft.     Tenderness: There is abdominal tenderness in the suprapubic area. There is no right CVA tenderness, left CVA tenderness, guarding or rebound. Negative signs include McBurney's sign.  Genitourinary:    Vagina: Vaginal discharge present.     Cervix: Discharge present. No cervical motion tenderness.     Comments: Sharrie Rothman RN present as chaperone.  Diffuse discomfort throughout bimanual, no specific CMT.  Musculoskeletal:     Cervical back: Neck supple.  Skin:    General: Skin is warm and dry.     Findings: No rash.  Neurological:     Mental Status: She is alert.     Comments: Clear speech.   Psychiatric:        Behavior: Behavior normal.     ED Results / Procedures / Treatments   Labs (all labs ordered are listed, but only abnormal results are displayed) Labs Reviewed  WET PREP, GENITAL - Abnormal; Notable for the following components:      Result Value   Clue Cells Wet Prep HPF POC PRESENT (*)    WBC, Wet Prep HPF POC FEW (*)    All other components within normal limits  URINALYSIS, ROUTINE W REFLEX MICROSCOPIC  PREGNANCY, URINE  HIV ANTIBODY (ROUTINE TESTING W REFLEX)  RPR  GC/CHLAMYDIA PROBE AMP ([place]) NOT AT [facility]    EKG None  Radiology US PELVIS (TRANSABDOMINAL ONLY)  Result Date: 12/13/2019 CLINICAL DATA:  Initial evaluation for acute suprapubic pain for 1 day. EXAM: TRANSABDOMINAL ULTRASOUND OF PELVIS DOPPLER ULTRASOUND OF OVARIES TECHNIQUE: Transabdominal ultrasound  examination of the pelvis was performed including evaluation of the uterus, ovaries, adnexal regions, and pelvic cul-de-sac. Color and duplex Doppler ultrasound was utilized to evaluate blood flow to the ovaries. COMPARISON:  Prior ultrasound from 12/19/2016. FINDINGS: Uterus Measurements: 8.3 x 6.3 x 8.2 cm. 4.1 x 3.1 x 4.8 cm intramural fibroid present at the posterior mid uterine body. 3.1 x 2.6 x 2.7 cm intramural fibroid present at the  right posterior uterine body. 4.1 x 3.6 x 4.1 cm subserosal fibroid present at the right posterior uterine body. Additional 4.2 x 1.8 x 1.6 cm fibroid at the left uterine body. Endometrium Thickness: 6 mm.  No focal abnormality visualized. Right ovary Measurements: 3.4 x 2.0 x 3.0 cm = volume: 10.8 mL. Normal appearance/no adnexal mass. Left ovary Measurements: 2.9 x 2.4 x 2.1 cm = volume: 7.8 mL. Normal appearance/no adnexal mass. Pulsed Doppler evaluation demonstrates normal low-resistance arterial and venous waveforms in both ovaries. Other: No free fluid within the pelvis. IMPRESSION: 1. Fibroid uterus as detailed above. 2. Otherwise unremarkable pelvic ultrasound. No evidence for torsion or other acute abnormality. Electronically Signed   By: Jeannine Boga M.D.   On: 12/13/2019 21:01   US PELVIC DOPPLER (TORSION R/O OR MASS ARTERIAL FLOW)  Result Date: 12/13/2019 CLINICAL DATA:  Initial evaluation for acute suprapubic pain for 1 day. EXAM: TRANSABDOMINAL ULTRASOUND OF PELVIS DOPPLER ULTRASOUND OF OVARIES TECHNIQUE: Transabdominal ultrasound examination of the pelvis was performed including evaluation of the uterus, ovaries, adnexal regions, and pelvic cul-de-sac. Color and duplex Doppler ultrasound was utilized to evaluate blood flow to the ovaries. COMPARISON:  Prior ultrasound from 12/19/2016. FINDINGS: Uterus Measurements: 8.3 x 6.3 x 8.2 cm. 4.1 x 3.1 x 4.8 cm intramural fibroid present at the posterior mid uterine body. 3.1 x 2.6 x 2.7 cm intramural fibroid  present at the right posterior uterine body. 4.1 x 3.6 x 4.1 cm subserosal fibroid present at the right posterior uterine body. Additional 4.2 x 1.8 x 1.6 cm fibroid at the left uterine body. Endometrium Thickness: 6 mm.  No focal abnormality visualized. Right ovary Measurements: 3.4 x 2.0 x 3.0 cm = volume: 10.8 mL. Normal appearance/no adnexal mass. Left ovary Measurements: 2.9 x 2.4 x 2.1 cm = volume: 7.8 mL. Normal appearance/no adnexal mass. Pulsed Doppler evaluation demonstrates normal low-resistance arterial and venous waveforms in both ovaries. Other: No free fluid within the pelvis. IMPRESSION: 1. Fibroid uterus as detailed above. 2. Otherwise unremarkable pelvic ultrasound. No evidence for torsion or other acute abnormality. Electronically Signed   By: Jeannine Boga M.D.   On: 12/13/2019 21:01    Procedures Procedures (including critical care time)  Medications Ordered in ED Medications - No data to display  ED Course  I have reviewed the triage vital signs and the nursing notes.  Pertinent labs & imaging results that were available during my care of the patient were reviewed by me and considered in my medical decision making (see chart for details).    MDM Rules/Calculators/A&P                      Patient presents to the ED with complaints of pelvic pain & vaginal discharge that began today. She is nontoxic appearing, resting comfortably, vitals WNL. On exam patient has mild suprapubic tenderness with diffuse discomfort throughout bimanual exam, no specific cervical motion tenderness, does have white discharge present.   Preg test: Negative- doubt pain related to pregnancy or ectopic pregnancy.  UA: no UTI. No hematuria to raise concern for nephrolithiasis also pain is diffuse to pelvis as opposed to unilatera.  Wet prep: BV. No trich/yeast.  [place]: Fibroids, otherwise unremarkable- no signs of torsion or other acute process.   On re-assessment patient is feeling much better  pain is resolved without analgesics here in the ED. We re-discussed level of concern for STIs- patient states she has not had any known exposures or concern that her  significant other was unfaithful, given her pain is resolved, no CMT, and she is in a monogamous relationship feel PID is less likely- GC/chlamydia tests pending as well as HIV/RPR- we discussed if GC/chlamydia are positive would want her to follow up closely with OBGYN for repeat exam. No focal mcburneys point tenderness to raise concern for appendicitis. Will treat for BV w/ flagyl, will also give naproxen to help with pain should it return, possibly related to fibroids. OBGYN follow up. I discussed results, treatment plan, need for follow-up, and return precautions with the patient. Provided opportunity for questions, patient confirmed understanding and is in agreement with plan.   Final Clinical Impression(s) / ED Diagnoses Final diagnoses:  Bacterial vaginosis  Fibroids    Rx / DC Orders ED Discharge Orders         Ordered    metroNIDAZOLE (FLAGYL) 500 MG tablet  2 times daily     12/13/19 2132    naproxen (NAPROSYN) 500 MG tablet  2 times daily     12/13/19 2132           Leafy Kindle 12/13/19 2138    Virgel Manifold, MD 12/14/19 [phone]

## 2019-12-13 NOTE — ED Triage Notes (Signed)
Pt here with suprapubic pain and and vaginal discharge starting today. No urinary sx.

## 2019-12-13 NOTE — Discharge Instructions (Addendum)
You were seen in the ER today for pelvic pain and vaginal discharge.   Your pregnancy test was negative.  Your urine did not show a UTI.  Your wet prep (pelvic sample) showed findings of bacterial vaginosis (see attached handout), it did not show signs of trichomonas (STD) or yeast.  Your ultrasound showed findings of fibroids (see attached handout) and no other acute abnormality.   We are sending you home with the following medicines:  - Flagyl- this is an antibiotic to treat the bacterial vaginosis- please take this as prescribed. Do not drink alcohol with this medication as it can have severe dangerous side effects.  - Naproxen- this is a nonsteroidal anti-inflammatory medication that will help with pain and swelling. Be sure to take this medication as prescribed with food, 1 pill every 12 hours,  It should be taken with food, as it can cause stomach upset, and more seriously, stomach bleeding. Do not take other nonsteroidal anti-inflammatory medications with this such as Advil, Motrin, Aleve, Mobic, Goodie Powder, or Motrin.    We have prescribed you new medication(s) today. Discuss the medications prescribed today with your pharmacist as they can have adverse effects and interactions with your other medicines including over the counter and prescribed medications. Seek medical evaluation if you start to experience new or abnormal symptoms after taking one of these medicines, seek care immediately if you start to experience difficulty breathing, feeling of your throat closing, facial swelling, or rash as these could be indications of a more serious allergic reaction  We have tested you for gonorrhea, chlamydia, HIV, and syphilis- we will call you if any of these results are positive- if positive you will need to inform all sexual partners and seek treatment, if positive we would have concern that you may have a condition known as pelvic inflammatory disease and would want you to follow up closely  with an OBGYN provider for a repeat exam. Please follow up with either your primary care provider or with OBGYN within 3-5 days. Return to the ED for new or worsening symptoms including but not limited to return of pain, increased pain, new/different pain, fever, problems urinating, or any other concerns

## 2019-12-14 LAB — RPR: RPR Ser Ql: NONREACTIVE

## 2019-12-14 MED FILL — NAPROXEN 500 MG TABS: 500 | 5 days supply | Qty: 10 | Fill #0

## 2019-12-14 MED FILL — METRONIDAZOLE 500 MG TABS: 500 | 7 days supply | Qty: 14 | Fill #0

## 2019-12-15 LAB — GC/CHLAMYDIA PROBE AMP (~~LOC~~) NOT AT ARMC
Chlamydia: NEGATIVE
Neisseria Gonorrhea: NEGATIVE

## 2019-12-22 ENCOUNTER — Ambulatory Visit: Payer: Self-pay | Admitting: Family Medicine

## 2019-12-22 ENCOUNTER — Ambulatory Visit: Payer: Self-pay | Admitting: Medical

## 2020-01-23 DIAGNOSIS — R7303 Prediabetes: Secondary | ICD-10-CM | POA: Insufficient documentation

## 2020-01-23 NOTE — Patient Instructions (Addendum)
It was good to see you again today, I will be in touch with your labs as soon as possible Work on getting exercise into your routine You will probably be due for a tetanus vaccine when your son is 10- however, since we are not sure if you have a dirty (outdoor) cut I would suggest a booster  I would encourage you to get your covid vaccine asap!   Take care   Health Maintenance, Female Adopting a healthy lifestyle and getting preventive care are important in promoting health and wellness. Ask your health care provider about:  The right schedule for you to have regular tests and exams.  Things you can do on your own to prevent diseases and keep yourself healthy. What should I know about diet, weight, and exercise? Eat a healthy diet   Eat a diet that includes plenty of vegetables, fruits, low-fat dairy products, and lean protein.  Do not eat a lot of foods that are high in solid fats, added sugars, or sodium. Maintain a healthy weight Body mass index (BMI) is used to identify weight problems. It estimates body fat based on height and weight. Your health care provider can help determine your BMI and help you achieve or maintain a healthy weight. Get regular exercise Get regular exercise. This is one of the most important things you can do for your health. Most adults should:  Exercise for at least 150 minutes each week. The exercise should increase your heart rate and make you sweat (moderate-intensity exercise).  Do strengthening exercises at least twice a week. This is in addition to the moderate-intensity exercise.  Spend less time sitting. Even light physical activity can be beneficial. Watch cholesterol and blood lipids Have your blood tested for lipids and cholesterol at 32 years of age, then have this test every 5 years. Have your cholesterol levels checked more often if:  Your lipid or cholesterol levels are high.  You are older than 32 years of age.  You are at high risk  for heart disease. What should I know about cancer screening? Depending on your health history and family history, you may need to have cancer screening at various ages. This may include screening for:  Breast cancer.  Cervical cancer.  Colorectal cancer.  Skin cancer.  Lung cancer. What should I know about heart disease, diabetes, and high blood pressure? Blood pressure and heart disease  High blood pressure causes heart disease and increases the risk of stroke. This is more likely to develop in people who have high blood pressure readings, are of African descent, or are overweight.  Have your blood pressure checked: ? Every 3-5 years if you are 15-17 years of age. ? Every year if you are 29 years old or older. Diabetes Have regular diabetes screenings. This checks your fasting blood sugar level. Have the screening done:  Once every three years after age 39 if you are at a normal weight and have a low risk for diabetes.  More often and at a younger age if you are overweight or have a high risk for diabetes. What should I know about preventing infection? Hepatitis B If you have a higher risk for hepatitis B, you should be screened for this virus. Talk with your health care provider to find out if you are at risk for hepatitis B infection. Hepatitis C Testing is recommended for:  Everyone born from [birth year] through 1963.  Anyone with known risk factors for hepatitis C. Sexually transmitted infections (STIs)  Get screened for STIs, including gonorrhea and chlamydia, if: ? You are sexually active and are younger than 32 years of age. ? You are older than 32 years of age and your health care provider tells you that you are at risk for this type of infection. ? Your sexual activity has changed since you were last screened, and you are at increased risk for chlamydia or gonorrhea. Ask your health care provider if you are at risk.  Ask your health care provider about whether you are  at high risk for HIV. Your health care provider may recommend a prescription medicine to help prevent HIV infection. If you choose to take medicine to prevent HIV, you should first get tested for HIV. You should then be tested every 3 months for as long as you are taking the medicine. Pregnancy  If you are about to stop having your period (premenopausal) and you may become pregnant, seek counseling before you get pregnant.  Take 400 to 800 micrograms (mcg) of folic acid every day if you become pregnant.  Ask for birth control (contraception) if you want to prevent pregnancy. Osteoporosis and menopause Osteoporosis is a disease in which the bones lose minerals and strength with aging. This can result in bone fractures. If you are 71 years old or older, or if you are at risk for osteoporosis and fractures, ask your health care provider if you should:  Be screened for bone loss.  Take a calcium or vitamin D supplement to lower your risk of fractures.  Be given hormone replacement therapy (HRT) to treat symptoms of menopause. Follow these instructions at home: Lifestyle  Do not use any products that contain nicotine or tobacco, such as cigarettes, e-cigarettes, and chewing tobacco. If you need help quitting, ask your health care provider.  Do not use street drugs.  Do not share needles.  Ask your health care provider for help if you need support or information about quitting drugs. Alcohol use  Do not drink alcohol if: ? Your health care provider tells you not to drink. ? You are pregnant, may be pregnant, or are planning to become pregnant.  If you drink alcohol: ? Limit how much you use to 0-1 drink a day. ? Limit intake if you are breastfeeding.  Be aware of how much alcohol is in your drink. In the [place] one drink equals one 12 oz bottle of beer (355 mL), one 5 oz glass of wine (148 mL), or one 1 oz glass of hard liquor (44 mL). General instructions  Schedule regular health,  dental, and eye exams.  Stay current with your vaccines.  Tell your health care provider if: ? You often feel depressed. ? You have ever been abused or do not feel safe at home. Summary  Adopting a healthy lifestyle and getting preventive care are important in promoting health and wellness.  Follow your health care provider's instructions about healthy diet, exercising, and getting tested or screened for diseases.  Follow your health care provider's instructions on monitoring your cholesterol and blood pressure. This information is not intended to replace advice given to you by your health care provider. Make sure you discuss any questions you have with your health care provider. Document Revised: 08/24/2018 Document Reviewed: 08/24/2018 Elsevier Patient Education  2020 [employer].

## 2020-01-23 NOTE — Progress Notes (Addendum)
[facility] [place], [place], [place] [address] [phone] [id number]  Date:  01/24/2020   Name:  Nicole Kaiser   DOB:  01/21/1986   MRN:  [id number]  PCP:  Darreld Mclean, MD    Chief Complaint: Annual Exam   History of Present Illness:  Nicole Kaiser is a 34 y.o. very pleasant female patient who presents with the following:  Generally healthy young woman with history of uterine fibroids and prediabetes, here today for routine physical Last seen by myself in 2018-this is a new patient by 3-year rule She delivered a baby boy in 2017, she did have an emergency C-section and preeclampsia He is turning 6 soon  COVID-19 series- encouraged her to do this  Tetanus- suspect she had a Tdap during pregnancy but we don't have documentation  Pap-2018, can update today- per GYN, she has an appt today  Blood work is [facility] in 2018, A1c 6.3 at that time  She has generally been well  She works for the [employer]  No family history of breast cancer    Patient Active Problem List   Diagnosis Date Noted  . Prediabetes 01/23/2020  . History of uterine fibroid 11/11/2016  . History of iron deficiency anemia 11/11/2016  . Menorrhagia with regular cycle 11/11/2016    Past Medical History:  Diagnosis Date  . Cervical dysplasia 2013   Normal follow up paps  . Chicken pox   . History of UTI   . Hypertension affecting pregnancy   . Vaginal Pap smear, abnormal     Past Surgical History:  Procedure Laterality Date  . CESAREAN SECTION  02/14/2016  . colposopy      Social History   Tobacco Use  . Smoking status: Never Smoker  . Smokeless tobacco: Never Used  Substance Use Topics  . Alcohol use: Yes    Alcohol/week: 1.0 standard drinks    Types: 1 Glasses of wine per week  . Drug use: No    Family History  Problem Relation Age of Onset  . Alcoholism Father   . Diabetes Maternal Grandmother   . Cancer Neg  Hx   . Hypertension Neg Hx   . Stroke Neg Hx     No Known Allergies  Medication list has been reviewed and updated.  Current Outpatient Medications on File Prior to Visit  Medication Sig Dispense Refill  . tranexamic acid (LYSTEDA) 650 MG TABS tablet Take 2 tablets (1,300 mg total) by mouth 3 (three) times daily. Take during menses for a maximum of five days (Patient not taking: Reported on 01/24/2020) 30 tablet 3   No current facility-administered medications on file prior to visit.    Review of Systems:  As per HPI- otherwise negative.   Physical Examination: Vitals:   01/24/20 1328  BP: 110/70  Pulse: 87  Temp: (!) 95.8 F (35.4 C)  SpO2: 98%   Vitals:   01/24/20 1328  Weight: 167 lb 6 oz (75.9 kg)  Height: 5\' 1"  (1.549 m)   Body mass index is 31.63 kg/m. Ideal Body Weight: Weight in (lb) to have BMI = 25: 132  GEN: no acute distress. Overweight, otherwise looks well  HEENT: Atraumatic, Normocephalic.   Bilateral TM wnl, oropharynx normal.  PEERL,EOMI.   Ears and Nose: No external deformity. CV: RRR, No M/G/R. No JVD. No thrill. No extra heart sounds. PULM: CTA B, no wheezes, crackles, rhonchi. No retractions. No resp.  distress. No accessory muscle use. ABD: S, NT, ND, +BS. No rebound. No HSM. EXTR: No c/c/e PSYCH: Normally interactive. Conversant.    Assessment and Plan: Physical exam  Prediabetes - Plan: Comprehensive metabolic panel, Hemoglobin A1c  Screening for hyperlipidemia - Plan: Lipid panel  Screening for deficiency anemia - Plan: CBC  Screening for thyroid disorder - Plan: TSH  Here today for a CPE Labs pending as above Encouraged healthy diet and exercise habits, covid 19 vaccine Will plan further follow- up pending labs. Pt also mentions that 4 years after delivery she still gets some milky discharge from both breasts, and once had a bloody discharge perhaps 2 weeks ago. She is seeing GYN after this appt and agrees to discuss with her GYN  provider  This visit occurred during the SARS-CoV-2 public health emergency.  Safety protocols were in place, including screening questions prior to the visit, additional usage of staff PPE, and extensive cleaning of exam room while observing appropriate contact time as indicated for disinfecting solutions.   Moderate medical decision making today Signed Lamar Blinks, MD  Received her labs as below, message to patient  Results for orders placed or performed in visit on 01/24/20  CBC  Result Value Ref Range   WBC 5.7 4.0 - 10.5 K/uL   RBC 3.62 (L) 3.87 - 5.11 Mil/uL   Platelets 328.0 150.0 - 400.0 K/uL   Hemoglobin 11.2 (L) 12.0 - 15.0 g/dL   HCT 33.8 (L) 36.0 - 46.0 %   MCV 93.2 78.0 - 100.0 fl   MCHC 33.0 30.0 - 36.0 g/dL   RDW 14.2 11.5 - 15.5 %  Comprehensive metabolic panel  Result Value Ref Range   Sodium 137 135 - 145 mEq/L   Potassium 3.7 3.5 - 5.1 mEq/L   Chloride 103 96 - 112 mEq/L   CO2 29 19 - 32 mEq/L   Glucose, Bld 82 70 - 99 mg/dL   BUN 13 6 - 23 mg/dL   Creatinine, Ser 0.76 0.40 - 1.20 mg/dL   Total Bilirubin 0.6 0.2 - 1.2 mg/dL   Alkaline Phosphatase 53 39 - 117 U/L   AST 13 0 - 37 U/L   ALT 6 0 - 35 U/L   Total Protein 7.4 6.0 - 8.3 g/dL   Albumin 4.3 3.5 - 5.2 g/dL   GFR 106.81 >60.00 mL/min   Calcium 9.3 8.4 - 10.5 mg/dL  Hemoglobin A1c  Result Value Ref Range   Hgb A1c MFr Bld 6.0 4.6 - 6.5 %  Lipid panel  Result Value Ref Range   Cholesterol 185 0 - 200 mg/dL   Triglycerides 80.0 0.0 - 149.0 mg/dL   HDL 38.00 (L) >39.00 mg/dL   VLDL 16.0 0.0 - 40.0 mg/dL   LDL Cholesterol 131 (H) 0 - 99 mg/dL   Total CHOL/HDL Ratio 5    NonHDL 146.98   TSH  Result Value Ref Range   TSH 0.74 0.35 - 4.50 uIU/mL

## 2020-01-24 ENCOUNTER — Other Ambulatory Visit (HOSPITAL_COMMUNITY)
Admission: RE | Admit: 2020-01-24 | Discharge: 2020-01-24 | Disposition: A | Discharge status: Home / Self Care | Payer: Managed Care, Other (non HMO) | Source: Ambulatory Visit | Attending: Obstetrics & Gynecology | Admitting: Obstetrics & Gynecology

## 2020-01-24 ENCOUNTER — Other Ambulatory Visit: Payer: Self-pay

## 2020-01-24 ENCOUNTER — Encounter: Payer: Self-pay | Admitting: Family Medicine

## 2020-01-24 ENCOUNTER — Encounter: Payer: Self-pay | Admitting: Obstetrics & Gynecology

## 2020-01-24 ENCOUNTER — Ambulatory Visit (INDEPENDENT_AMBULATORY_CARE_PROVIDER_SITE_OTHER): Payer: Managed Care, Other (non HMO) | Admitting: Obstetrics & Gynecology

## 2020-01-24 ENCOUNTER — Ambulatory Visit (INDEPENDENT_AMBULATORY_CARE_PROVIDER_SITE_OTHER): Payer: Managed Care, Other (non HMO) | Admitting: Family Medicine

## 2020-01-24 VITALS — BP 110/70 | HR 87 | Temp 95.8°F | Ht 61.0 in | Wt 167.4 lb

## 2020-01-24 VITALS — BP 105/87 | HR 83 | Ht 61.0 in | Wt 167.0 lb

## 2020-01-24 DIAGNOSIS — Z01419 Encounter for gynecological examination (general) (routine) without abnormal findings: Secondary | ICD-10-CM | POA: Diagnosis not present

## 2020-01-24 DIAGNOSIS — Z1329 Encounter for screening for other suspected endocrine disorder: Secondary | ICD-10-CM | POA: Diagnosis not present

## 2020-01-24 DIAGNOSIS — Z13220 Encounter for screening for lipoid disorders: Secondary | ICD-10-CM | POA: Diagnosis not present

## 2020-01-24 DIAGNOSIS — N898 Other specified noninflammatory disorders of vagina: Secondary | ICD-10-CM | POA: Insufficient documentation

## 2020-01-24 DIAGNOSIS — N643 Galactorrhea not associated with childbirth: Secondary | ICD-10-CM | POA: Diagnosis not present

## 2020-01-24 DIAGNOSIS — Z0000 Encounter for general adult medical examination without abnormal findings: Secondary | ICD-10-CM

## 2020-01-24 DIAGNOSIS — Z130 Encounter for screening for diseases of the blood and blood-forming organs and certain disorders involving the immune mechanism: Secondary | ICD-10-CM

## 2020-01-24 DIAGNOSIS — R7303 Prediabetes: Secondary | ICD-10-CM | POA: Diagnosis not present

## 2020-01-24 DIAGNOSIS — Z3009 Encounter for other general counseling and advice on contraception: Secondary | ICD-10-CM

## 2020-01-24 DIAGNOSIS — Z124 Encounter for screening for malignant neoplasm of cervix: Secondary | ICD-10-CM

## 2020-01-24 LAB — COMPREHENSIVE METABOLIC PANEL
ALT: 6 U/L (ref 0–35)
AST: 13 U/L (ref 0–37)
Albumin: 4.3 g/dL (ref 3.5–5.2)
Alkaline Phosphatase: 53 U/L (ref 39–117)
BUN: 13 mg/dL (ref 6–23)
CO2: 29 mEq/L (ref 19–32)
Calcium: 9.3 mg/dL (ref 8.4–10.5)
Chloride: 103 mEq/L (ref 96–112)
Creatinine, Ser: 0.76 mg/dL (ref 0.40–1.20)
GFR: 106.81 mL/min (ref >60.00)
Glucose, Bld: 82 mg/dL (ref 70–99)
Potassium: 3.7 mEq/L (ref 3.5–5.1)
Sodium: 137 mEq/L (ref 135–145)
Total Bilirubin: 0.6 mg/dL (ref 0.2–1.2)
Total Protein: 7.4 g/dL (ref 6.0–8.3)

## 2020-01-24 LAB — CBC
HCT: 33.8 % — ABNORMAL LOW (ref 36.0–46.0)
Hemoglobin: 11.2 g/dL — ABNORMAL LOW (ref 12.0–15.0)
MCHC: 33 g/dL (ref 30.0–36.0)
MCV: 93.2 fl (ref 78.0–100.0)
Platelets: 328 10*3/uL (ref 150.0–400.0)
RBC: 3.62 Mil/uL — ABNORMAL LOW (ref 3.87–5.11)
RDW: 14.2 % (ref 11.5–15.5)
WBC: 5.7 10*3/uL (ref 4.0–10.5)

## 2020-01-24 LAB — LIPID PANEL
Cholesterol: 185 mg/dL (ref 0–200)
HDL: 38 mg/dL — ABNORMAL LOW (ref >39.00)
LDL Cholesterol: 131 mg/dL — ABNORMAL HIGH (ref 0–99)
NonHDL: 146.98
Total CHOL/HDL Ratio: 5
Triglycerides: 80 mg/dL (ref 0.0–149.0)
VLDL: 16 mg/dL (ref 0.0–40.0)

## 2020-01-24 LAB — TSH: TSH: 0.74 u[IU]/mL (ref 0.35–4.50)

## 2020-01-24 LAB — HEMOGLOBIN A1C: Hgb A1c MFr Bld: 6 % (ref 4.6–6.5)

## 2020-01-24 MED ORDER — NORETHIN ACE-ETH ESTRAD-FE 1-20 MG-MCG(24) PO TABS: 1.0000 | ORAL_TABLET | Freq: Every day | ORAL | 11 refills | Status: DC

## 2020-01-24 NOTE — Progress Notes (Signed)
Subjective:     Nicole Kaiser is a 34 y.o. female here for a routine exam. G1P1 LMP late April Current complaints: vaginal itching. Pt is sexually active. No contraception. Is not attempting to get pregnant any time soon. Pt reports nipple discharge since the birth of her sons 4 years prev. No bleeding noted.   Pt reports a history of uterine fibroids that were dx'd in pregnancy.    Gynecologic History Patient's last menstrual period was 01/02/2020 (within days). Contraception: none Last Pap: 2018. Results were: normal Last mammogram: n/a.  Obstetric History OB History  Gravida Para Term Preterm AB Living  1 1          SAB TAB Ectopic Multiple Live Births          1    # Outcome Date GA Lbr Len/2nd Weight Sex Delivery Anes PTL Lv  1 Para    5 lb 2 oz (2.325 kg) M CS-Unspec      The following portions of the patient's history were reviewed and updated as appropriate: allergies, current medications, past family history, past medical history, past social history, past surgical history and problem list.  Review of Systems Pertinent items are noted in HPI.    Objective:  BP 105/87   Pulse 83   Ht 5\' 1"  (1.549 m)   Wt 167 lb (75.8 kg)   LMP 01/02/2020 (Within Days)   BMI 31.55 kg/m  General Appearance:    Alert, cooperative, no distress, appears stated age  Head:    Normocephalic, without obvious abnormality, atraumatic  Eyes:    conjunctiva/corneas clear, EOM's intact, both eyes  Ears:    Normal external ear canals, both ears  Nose:   Nares normal, septum midline, mucosa normal, no drainage    or sinus tenderness  Throat:   Lips, mucosa, and tongue normal; teeth and gums normal  Neck:   Supple, symmetrical, trachea midline, no adenopathy;    thyroid:  no enlargement/tenderness/nodules  Back:     Symmetric, no curvature, ROM normal, no CVA tenderness  Lungs:     respirations unlabored  Chest Wall:    No tenderness or deformity   Heart:    Regular rate and rhythm  Breast Exam:     No tenderness, masses, or nipple abnormality  Abdomen:     Soft, non-tender, bowel sounds active all four quadrants,    no masses, no organomegaly  Genitalia:    Normal female without lesion, discharge or tenderness   Sl enlarged. 6 weeks sized. Small fibroids felt. NT  Extremities:   Extremities normal, atraumatic, no cyanosis or edema  Pulses:   2+ and symmetric all extremities  Skin:   Skin color, texture, turgor normal, no rashes or lesions     Assessment:    Healthy female exam.   Fibroid uterus- asymptomatic Contraception counseling - Reviewed all forms of birth control options available including abstinence; over the counter/barrier methods; hormonal contraceptive medication including pill, patch, ring, injection,contraceptive implant; hormonal and nonhormonal IUDs; permanent sterilization options including vasectomy and the various tubal sterilization modalities. Risks and benefits reviewed. No contraindications to OCPs Galactorrhea- needs Prolactin. TSH was ordered earlier by her primary care provider.        Plan:   Nicole Kaiser was seen today for annual exam.  Diagnoses and all orders for this visit:  Well female exam with routine gynecological exam -     Cytology - PAP( [facility]) -     Cervicovaginal ancillary only( [facility]) -  Norethindrone Acetate-Ethinyl Estrad-FE (LOESTRIN 24 FE) 1-20 MG-MCG(24) tablet; Take 1 tablet by mouth daily.  Galactorrhea in female -     Prolactin  Vaginal itching  Encounter for counseling regarding contraception -     Norethindrone Acetate-Ethinyl Estrad-FE (LOESTRIN 24 FE) 1-20 MG-MCG(24) tablet; Take 1 tablet by mouth daily.  Nicole Kaiser L. Harraway-Smith, M.D., [facility] June

## 2020-01-24 NOTE — Patient Instructions (Signed)
Oral Contraception Use Oral contraceptive pills (OCPs) are medicines that you take to prevent pregnancy. OCPs work by:  Preventing the ovaries from releasing eggs.  Thickening mucus in the lower part of the uterus (cervix), which prevents sperm from entering the uterus.  Thinning the lining of the uterus (endometrium), which prevents a fertilized egg from attaching to the endometrium. OCPs are highly effective when taken exactly as prescribed. However, OCPs do not prevent sexually transmitted infections (STIs). Safe sex practices, such as using condoms while on an OCP, can help prevent STIs. Before taking OCPs, you may have a physical exam, blood test, and Pap test. A Pap test involves taking a sample of cells from your cervix to check for cancer. Discuss with your health care provider the possible side effects of the OCP you may be prescribed. When you start an OCP, be aware that it can take 2-3 months for your body to adjust to changes in hormone levels. How to take oral contraceptive pills Follow instructions from your health care provider about how to start taking your first cycle of OCPs. Your health care provider may recommend that you:  Start the pill on day 1 of your menstrual period. If you start at this time, you will not need any backup form of birth control (contraception), such as condoms.  Start the pill on the first Sunday after your menstrual period or on the day you get your prescription. In these cases, you will need to use backup contraception for the first week.  Start the pill at any time of your cycle. ? If you take the pill within 5 days of the start of your period, you will not need a backup form of contraception. ? If you start at any other time of your menstrual cycle, you will need to use another form of contraception for 7 days. If your OCP is the type called a minipill, it will protect you from pregnancy after taking it for 2 days (48 hours), and you can stop using  backup contraception after that time. After you have started taking OCPs:  If you forget to take 1 pill, take it as soon as you remember. Take the next pill at the regular time.  If you miss 2 or more pills, call your health care provider. Different pills have different instructions for missed doses. Use backup birth control until your next menstrual period starts.  If you use a 28-day pack that contains inactive pills and you miss 1 of the last 7 pills (pills with no hormones), throw away the rest of the non-hormone pills and start a new pill pack. No matter which day you start the OCP, you will always start a new pack on that same day of the week. Have an extra pack of OCPs and a backup contraceptive method available in case you miss some pills or lose your OCP pack. Follow these instructions at home:  Do not use any products that contain nicotine or tobacco, such as cigarettes and e-cigarettes. If you need help quitting, ask your health care provider.  Always use a condom to protect against STIs. OCPs do not protect against STIs.  Use a calendar to mark the days of your menstrual period.  Read the information and directions that came with your OCP. Talk to your health care provider if you have questions. Contact a health care provider if:  You develop nausea and vomiting.  You have abnormal vaginal discharge or bleeding.  You develop a rash.    You miss your menstrual period. Depending on the type of OCP you are taking, this may be a sign of pregnancy. Ask your health care provider for more information.  You are losing your hair.  You need treatment for mood swings or depression.  You get dizzy when taking the OCP.  You develop acne after taking the OCP.  You become pregnant or think you may be pregnant.  You have diarrhea, constipation, and abdominal pain or cramps.  You miss 2 or more pills. Get help right away if:  You develop chest pain.  You develop shortness of  breath.  You have an uncontrolled or severe headache.  You develop numbness or slurred speech.  You develop visual or speech problems.  You develop pain, redness, and swelling in your legs.  You develop weakness or numbness in your arms or legs. Summary  Oral contraceptive pills (OCPs) are medicines that you take to prevent pregnancy.  OCPs do not prevent sexually transmitted infections (STIs). Always use a condom to protect against STIs.  When you start an OCP, be aware that it can take 2-3 months for your body to adjust to changes in hormone levels.  Read all the information and directions that come with your OCP. This information is not intended to replace advice given to you by your health care provider. Make sure you discuss any questions you have with your health care provider. Document Revised: 12/23/2018 Document Reviewed: 10/12/2016 Elsevier Patient Education  2020 [employer]  

## 2020-01-25 LAB — PROLACTIN: Prolactin: 31.7 ng/mL — ABNORMAL HIGH (ref 4.8–23.3)

## 2020-01-26 LAB — CERVICOVAGINAL ANCILLARY ONLY
Bacterial Vaginitis (gardnerella): NEGATIVE
Candida Glabrata: NEGATIVE
Candida Vaginitis: NEGATIVE
Comment: NEGATIVE
Comment: NEGATIVE
Comment: NEGATIVE

## 2020-01-29 ENCOUNTER — Encounter (HOSPITAL_BASED_OUTPATIENT_CLINIC_OR_DEPARTMENT_OTHER): Payer: Self-pay | Admitting: *Deleted

## 2020-01-29 ENCOUNTER — Other Ambulatory Visit: Payer: Self-pay

## 2020-01-29 ENCOUNTER — Emergency Department (HOSPITAL_BASED_OUTPATIENT_CLINIC_OR_DEPARTMENT_OTHER): Payer: Managed Care, Other (non HMO)

## 2020-01-29 ENCOUNTER — Emergency Department (HOSPITAL_BASED_OUTPATIENT_CLINIC_OR_DEPARTMENT_OTHER)
Admission: EM | Admit: 2020-01-29 | Discharge: 2020-01-29 | Disposition: A | Discharge status: Home / Self Care | Payer: Managed Care, Other (non HMO) | Attending: Emergency Medicine | Admitting: Emergency Medicine

## 2020-01-29 DIAGNOSIS — R0602 Shortness of breath: Secondary | ICD-10-CM | POA: Diagnosis not present

## 2020-01-29 DIAGNOSIS — Z20822 Contact with and (suspected) exposure to covid-19: Secondary | ICD-10-CM | POA: Diagnosis not present

## 2020-01-29 DIAGNOSIS — Z79899 Other long term (current) drug therapy: Secondary | ICD-10-CM | POA: Insufficient documentation

## 2020-01-29 DIAGNOSIS — J069 Acute upper respiratory infection, unspecified: Secondary | ICD-10-CM

## 2020-01-29 LAB — CYTOLOGY - PAP
Chlamydia: NEGATIVE
Comment: NEGATIVE
Comment: NEGATIVE
Comment: NORMAL
Diagnosis: NEGATIVE
High risk HPV: NEGATIVE
Neisseria Gonorrhea: NEGATIVE

## 2020-01-29 LAB — SARS CORONAVIRUS 2 BY RT PCR (HOSPITAL ORDER, PERFORMED IN ~~LOC~~ HOSPITAL LAB): SARS Coronavirus 2: NEGATIVE

## 2020-01-29 MED ORDER — AMOXICILLIN 500 MG PO CAPS
500.0000 mg | ORAL_CAPSULE | Freq: Three times a day (TID) | ORAL | 0 refills | Status: DC
Start: 2020-01-29 — End: 2021-09-22

## 2020-01-29 MED ORDER — ALBUTEROL SULFATE HFA 108 (90 BASE) MCG/ACT IN AERS
2.0000 | INHALATION_SPRAY | Freq: Once | RESPIRATORY_TRACT | Status: AC
  Administered 2020-01-29: 2 via RESPIRATORY_TRACT
  Filled 2020-01-29: qty 6.7

## 2020-01-29 NOTE — ED Provider Notes (Signed)
[facility] Provider Note   CSN: [id number] Arrival date & time: 01/29/20  1109     History Chief Complaint  Patient presents with  . Cough    Nicole Kaiser is a 34 y.o. female.  Patient is a 34 year old otherwise healthy female presenting with complaints of cough and shortness of breath.  This has worsened over the past 2 days.  Her cough has been nonproductive.  She denies fevers or chills.  She denies ill contacts or known exposures to Covid positive individuals.  She denies any leg swelling or calf pain.  The history is provided by the patient.  Cough Cough characteristics:  Non-productive Severity:  Moderate Onset quality:  Sudden Duration:  2 days Timing:  Constant Progression:  Worsening Chronicity:  New Smoker: no   Relieved by:  Nothing Worsened by:  Nothing      Past Medical History:  Diagnosis Date  . Cervical dysplasia 2013   Normal follow up paps  . Chicken pox   . History of UTI   . Hypertension affecting pregnancy   . Vaginal Pap smear, abnormal     Patient Active Problem List   Diagnosis Date Noted  . Prediabetes 01/23/2020  . History of uterine fibroid 11/11/2016  . History of iron deficiency anemia 11/11/2016  . Menorrhagia with regular cycle 11/11/2016    Past Surgical History:  Procedure Laterality Date  . CESAREAN SECTION  02/14/2016  . colposopy       OB History    Gravida  1   Para  1   Term      Preterm      AB      Living        SAB      TAB      Ectopic      Multiple      Live Births  1           Family History  Problem Relation Age of Onset  . Alcoholism Father   . Diabetes Maternal Grandmother   . Cancer Neg Hx   . Hypertension Neg Hx   . Stroke Neg Hx     Social History   Tobacco Use  . Smoking status: Never Smoker  . Smokeless tobacco: Never Used  Substance Use Topics  . Alcohol use: Yes    Alcohol/week: 1.0 standard drinks    Types: 1 Glasses of wine per  week  . Drug use: No    Home Medications Prior to Admission medications   Medication Sig Start Date End Date Taking? Authorizing Provider  Norethindrone Acetate-Ethinyl Estrad-FE (LOESTRIN 24 FE) 1-20 MG-MCG(24) tablet Take 1 tablet by mouth daily. 01/24/20   Lavonia Drafts, MD  tranexamic acid (LYSTEDA) 650 MG TABS tablet Take 2 tablets (1,300 mg total) by mouth 3 (three) times daily. Take during menses for a maximum of five days Patient not taking: Reported on 01/24/2020 08/25/17   Truett Mainland, DO    Allergies    Patient has no known allergies.  Review of Systems   Review of Systems  Respiratory: Positive for cough.   All other systems reviewed and are negative.   Physical Exam Updated Vital Signs BP 133/79 (BP Location: Left Arm)   Pulse 77   Temp 99.6 F (37.6 C) (Oral)   Resp 18   Ht 5\' 1"  (1.549 m)   Wt 76.3 kg   LMP 01/25/2020   SpO2 100%   BMI 31.78 kg/m  Physical Exam Vitals and nursing note reviewed.  Constitutional:      General: She is not in acute distress.    Appearance: She is well-developed. She is not diaphoretic.  HENT:     Head: Normocephalic and atraumatic.  Cardiovascular:     Rate and Rhythm: Normal rate and regular rhythm.     Heart sounds: No murmur. No friction rub. No gallop.   Pulmonary:     Effort: Pulmonary effort is normal. No respiratory distress.     Breath sounds: Normal breath sounds. No wheezing.  Abdominal:     General: Bowel sounds are normal. There is no distension.     Palpations: Abdomen is soft.     Tenderness: There is no abdominal tenderness.  Musculoskeletal:        General: No swelling or tenderness. Normal range of motion.     Cervical back: Normal range of motion and neck supple.     Right lower leg: No edema.     Left lower leg: No edema.  Skin:    General: Skin is warm and dry.  Neurological:     Mental Status: She is alert and oriented to person, place, and time.     ED Results / Procedures  / Treatments   Labs (all labs ordered are listed, but only abnormal results are displayed) Labs Reviewed  SARS CORONAVIRUS 2 BY RT PCR (HOSPITAL ORDER, [facility])    EKG None  Radiology No results found.  Procedures Procedures (including critical care time)  Medications Ordered in ED Medications - No data to display  ED Course  I have reviewed the triage vital signs and the nursing notes.  Pertinent labs & imaging results that were available during my care of the patient were reviewed by me and considered in my medical decision making (see chart for details).    MDM Rules/Calculators/A&P  Patient presents here with a 2-day history of chest congestion and cough that is nonproductive.  She describes wheezing and difficulty breathing.  Her vitals are stable with oxygen saturations of 100% and no tachycardia.  Lungs are clear.  X-ray shows no acute process and Covid test is negative.  I suspect a bronchitis with most likely a viral etiology.  The patient given an albuterol inhaler with some relief.  She will be discharged with the albuterol inhaler and over-the-counter medications.  If her symptoms or not improving in the next few days, she will be given a prescription for Zithromax.  Final Clinical Impression(s) / ED Diagnoses Final diagnoses:  None    Rx / DC Orders ED Discharge Orders    None       Veryl Speak, MD 01/29/20 1256

## 2020-01-29 NOTE — ED Notes (Signed)
Verbalizes d/c teaching on antibiotics. Ambulatory at D/C.

## 2020-01-29 NOTE — Discharge Instructions (Addendum)
Albuterol inhaler: 2 puffs every 4 hours as needed for wheezing.  Take over-the-counter cough and cold medications as needed for symptom relief.  If symptoms or not improving in the next few days, fill the prescription for amoxicillin you have been given today.  Return to the emergency department if symptoms significantly worsen or change in the meantime.

## 2020-01-29 NOTE — ED Triage Notes (Signed)
Cough x 2 days. Pain when she takes a deep breath.

## 2020-01-30 ENCOUNTER — Telehealth: Payer: Self-pay

## 2020-01-30 NOTE — Telephone Encounter (Signed)
Called pt regarding prolactin results. Unable to leave message because mailbox was full.  chiquita l wilson, CMA

## 2020-01-30 NOTE — Telephone Encounter (Signed)
Patient called and made aware of elevated prolactin and need to repeat it. Patient scheduled to have it draw on Friday morning per Dr. Ihor Dow. Kathrene Alu RN

## 2020-02-02 ENCOUNTER — Other Ambulatory Visit: Payer: Managed Care, Other (non HMO)

## 2020-02-02 ENCOUNTER — Other Ambulatory Visit: Payer: Self-pay

## 2020-02-02 DIAGNOSIS — N643 Galactorrhea not associated with childbirth: Secondary | ICD-10-CM

## 2020-02-02 NOTE — Progress Notes (Signed)
Patient presents for redraw of prolactin. Patient sent to lab. Kathrene Alu RN

## 2020-02-03 LAB — PROLACTIN: Prolactin: 15.7 ng/mL (ref 4.8–23.3)

## 2021-03-02 IMAGING — DX DG CHEST 1V PORT
1 series · 1 of 1 positions shown · non-contrast
Comparison: None

CLINICAL DATA: Cough shortness of breath. Chest pain when taking a
deep breath.

EXAM:
PORTABLE CHEST 1 VIEW

[chest ap]
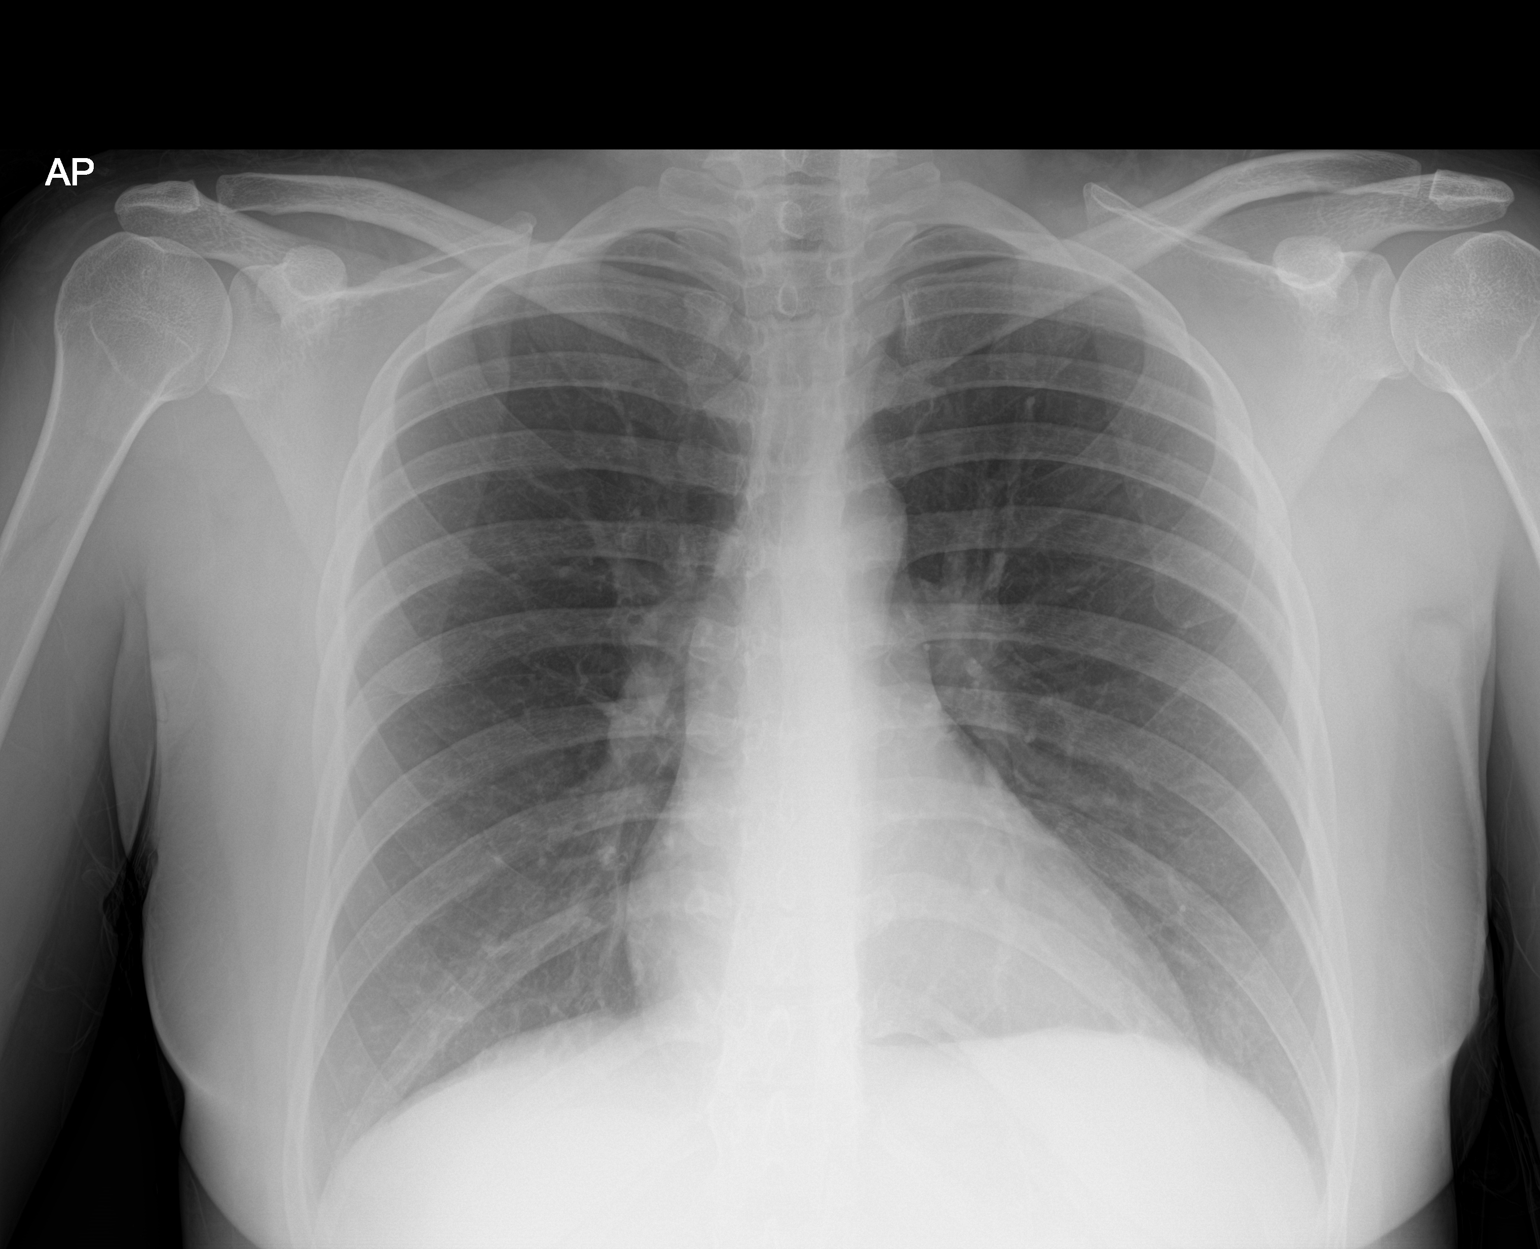

[1 of 1 positions shown; findings below may reference images not displayed]

FINDINGS: Cardiomediastinal contours and hilar structures are normal.

Lungs are clear. No sign of pleural effusion.

Visualized skeletal structures are unremarkable.
IMPRESSION: No active disease.

## 2021-09-22 ENCOUNTER — Ambulatory Visit
Admission: EM | Admit: 2021-09-22 | Discharge: 2021-09-22 | Disposition: A | Discharge status: Home / Self Care | Payer: No Typology Code available for payment source | Attending: Internal Medicine | Admitting: Internal Medicine

## 2021-09-22 ENCOUNTER — Encounter: Payer: Self-pay | Admitting: Emergency Medicine

## 2021-09-22 ENCOUNTER — Other Ambulatory Visit: Payer: Self-pay

## 2021-09-22 DIAGNOSIS — Z113 Encounter for screening for infections with a predominantly sexual mode of transmission: Secondary | ICD-10-CM | POA: Diagnosis present

## 2021-09-22 DIAGNOSIS — N898 Other specified noninflammatory disorders of vagina: Secondary | ICD-10-CM | POA: Diagnosis present

## 2021-09-22 MED ORDER — METRONIDAZOLE 500 MG PO TABS: 500.0000 mg | ORAL_TABLET | Freq: Two times a day (BID) | ORAL | 0 refills | Status: DC

## 2021-09-22 NOTE — ED Triage Notes (Signed)
Vaginal discomfort starting 2 days prior with associated odor. Denies new discharge, abdominal pain, fever, dysuria.

## 2021-09-22 NOTE — ED Provider Notes (Signed)
[facility]    CSN: [id number] Arrival date & time: 09/22/21  1805      History   Chief Complaint Chief Complaint  Patient presents with   Vaginitis    HPI Nicole Kaiser is a 36 y.o. female.   Patient presents with vaginal discomfort, vaginal discharge, vaginal odor that has been present for approximately 2 days.  Patient reports that her period completed approximately 3 days ago, and vaginal symptoms started.  She is not sure the color of the discharge but reports that her underwear are "damp".  Denies any urinary burning, urinary frequency, abdominal pain, pelvic pain, irregular vaginal bleeding, fever, back pain.  Denies any known exposure to STD but has had unprotected sexual intercourse recently.    Past Medical History:  Diagnosis Date   Cervical dysplasia 2013   Normal follow up paps   Chicken pox    History of UTI    Hypertension affecting pregnancy    Vaginal Pap smear, abnormal     Patient Active Problem List   Diagnosis Date Noted   Prediabetes 01/23/2020   History of uterine fibroid 11/11/2016   History of iron deficiency anemia 11/11/2016   Menorrhagia with regular cycle 11/11/2016    Past Surgical History:  Procedure Laterality Date   CESAREAN SECTION  02/14/2016   colposopy      OB History     Gravida  1   Para  1   Term      Preterm      AB      Living         SAB      IAB      Ectopic      Multiple      Live Births  1            Home Medications    Prior to Admission medications   Medication Sig Start Date End Date Taking? Authorizing Provider  metroNIDAZOLE (FLAGYL) 500 MG tablet Take 1 tablet (500 mg total) by mouth 2 (two) times daily. 09/22/21  Yes [facility] [place], Hildred Alamin E, FNP  Norethindrone Acetate-Ethinyl Estrad-FE (LOESTRIN 24 FE) 1-20 MG-MCG(24) tablet Take 1 tablet by mouth daily. 01/24/20   Lavonia Drafts, MD  tranexamic acid (LYSTEDA) 650 MG TABS tablet Take 2 tablets (1,300 mg total) by mouth 3  (three) times daily. Take during menses for a maximum of five days Patient not taking: Reported on 01/24/2020 08/25/17   Truett Mainland, DO    Family History Family History  Problem Relation Age of Onset   Alcoholism Father    Diabetes Maternal Grandmother    Cancer Neg Hx    Hypertension Neg Hx    Stroke Neg Hx     Social History Social History   Tobacco Use   Smoking status: Never   Smokeless tobacco: Never  Vaping Use   Vaping Use: Never used  Substance Use Topics   Alcohol use: Yes    Alcohol/week: 1.0 standard drink    Types: 1 Glasses of wine per week   Drug use: No     Allergies   Patient has no known allergies.   Review of Systems Review of Systems Per HPI  Physical Exam Triage Vital Signs ED Triage Vitals  Enc Vitals Group     BP 09/22/21 1911 119/73     Pulse Rate 09/22/21 1911 89     Resp 09/22/21 1911 20     Temp 09/22/21 1911 98.3 F (36.8 C)  Temp Source 09/22/21 1911 Oral     SpO2 09/22/21 1911 99 %     Weight --      Height --      Head Circumference --      Peak Flow --      Pain Score 09/22/21 1913 0     Pain Loc --      Pain Edu? --      Excl. in [place]? --    No data found.  Updated Vital Signs BP 119/73 (BP Location: Right Arm)    Pulse 89    Temp 98.3 F (36.8 C) (Oral)    Resp 20    SpO2 99%   Visual Acuity Right Eye Distance:   Left Eye Distance:   Bilateral Distance:    Right Eye Near:   Left Eye Near:    Bilateral Near:     Physical Exam Constitutional:      General: She is not in acute distress.    Appearance: Normal appearance. She is not toxic-appearing or diaphoretic.  HENT:     Head: Normocephalic and atraumatic.  Eyes:     Extraocular Movements: Extraocular movements intact.     Conjunctiva/sclera: Conjunctivae normal.  Cardiovascular:     Rate and Rhythm: Normal rate and regular rhythm.     Pulses: Normal pulses.     Heart sounds: Normal heart sounds.  Pulmonary:     Effort: Pulmonary effort is  normal. No respiratory distress.     Breath sounds: Normal breath sounds.  Abdominal:     General: Bowel sounds are normal. There is no distension.     Palpations: Abdomen is soft.     Tenderness: There is no abdominal tenderness.  Genitourinary:    Comments: Deferred with shared decision making.  Self swab performed. Neurological:     General: No focal deficit present.     Mental Status: She is alert and oriented to person, place, and time. Mental status is at baseline.  Psychiatric:        Mood and Affect: Mood normal.        Behavior: Behavior normal.        Thought Content: Thought content normal.        Judgment: Judgment normal.     UC Treatments / Results  Labs (all labs ordered are listed, but only abnormal results are displayed) Labs Reviewed  CERVICOVAGINAL ANCILLARY ONLY    EKG   Radiology No results found.  Procedures Procedures (including critical care time)  Medications Ordered in UC Medications - No data to display  Initial Impression / Assessment and Plan / UC Course  I have reviewed the triage vital signs and the nursing notes.  Pertinent labs & imaging results that were available during my care of the patient were reviewed by me and considered in my medical decision making (see chart for details).     Patient symptoms appear consistent with bacterial vaginosis.  Will opt to treat with metronidazole. Cervicovaginal swab pending.  Do not think that urinalysis is necessary given that there are no urinary symptoms.  Discussed strict return precautions.  Patient advised to refrain from sexual activity until test results and treatment are complete.  Patient verbalized understanding and was agreeable with plan. Final Clinical Impressions(s) / UC Diagnoses   Final diagnoses:  Vaginal discharge  Screening examination for venereal disease     Discharge Instructions      Your vaginal swab is pending.  You are being treated with  metronidazole antibiotic  due to suspicion of possible bacterial vaginosis.  We will call if vaginal swab is abnormal.    ED Prescriptions     Medication Sig Dispense Auth. Provider   metroNIDAZOLE (FLAGYL) 500 MG tablet Take 1 tablet (500 mg total) by mouth 2 (two) times daily. 14 tablet [place], Michele Rockers, [place]      PDMP not reviewed this encounter.   Teodora Medici, [place] 09/22/21 1931

## 2021-09-22 NOTE — Discharge Instructions (Addendum)
Your vaginal swab is pending.  You are being treated with metronidazole antibiotic due to suspicion of possible bacterial vaginosis.  We will call if vaginal swab is abnormal.

## 2021-09-24 LAB — CERVICOVAGINAL ANCILLARY ONLY
Bacterial Vaginitis (gardnerella): POSITIVE — AB
Candida Glabrata: NEGATIVE
Candida Vaginitis: NEGATIVE
Chlamydia: NEGATIVE
Comment: NEGATIVE
Comment: NEGATIVE
Comment: NEGATIVE
Comment: NEGATIVE
Comment: NEGATIVE
Comment: NORMAL
Neisseria Gonorrhea: NEGATIVE
Trichomonas: NEGATIVE

## 2021-10-01 ENCOUNTER — Telehealth: Payer: Self-pay

## 2021-10-01 MED ORDER — FLUCONAZOLE 150 MG PO TABS: 150.0000 mg | ORAL_TABLET | Freq: Every day | ORAL | 0 refills | Status: DC

## 2021-10-01 NOTE — Telephone Encounter (Signed)
Pt report side effects from antibiotic. Requesting diflucan for yeast infection symptoms.

## 2021-10-02 ENCOUNTER — Ambulatory Visit (INDEPENDENT_AMBULATORY_CARE_PROVIDER_SITE_OTHER): Payer: No Typology Code available for payment source | Admitting: Family Medicine

## 2021-10-02 ENCOUNTER — Encounter: Payer: Self-pay | Admitting: Family Medicine

## 2021-10-02 ENCOUNTER — Other Ambulatory Visit (HOSPITAL_BASED_OUTPATIENT_CLINIC_OR_DEPARTMENT_OTHER): Payer: Self-pay

## 2021-10-02 ENCOUNTER — Telehealth: Payer: Self-pay | Admitting: *Deleted

## 2021-10-02 VITALS — BP 122/60 | HR 73 | Temp 98.3°F | Resp 18 | Ht 61.0 in | Wt 188.9 lb

## 2021-10-02 DIAGNOSIS — H5789 Other specified disorders of eye and adnexa: Secondary | ICD-10-CM | POA: Diagnosis not present

## 2021-10-02 MED ORDER — TOBRAMYCIN 0.3 % OP SOLN
2.0000 [drp] | OPHTHALMIC | 0 refills | Status: AC
  Filled 2021-10-02: qty 5, 9d supply, fill #0

## 2021-10-02 MED ORDER — AMOXICILLIN-POT CLAVULANATE 875-125 MG PO TABS
1.0000 | ORAL_TABLET | Freq: Two times a day (BID) | ORAL | 0 refills | Status: AC
  Filled 2021-10-02: qty 14, 7d supply, fill #0

## 2021-10-02 NOTE — Telephone Encounter (Signed)
Patient scheduled for today

## 2021-10-02 NOTE — Telephone Encounter (Signed)
Who Is Calling Patient / Member / Family / Caregiver Call Type Triage / Clinical Relationship To Patient Self Return Phone Number [phone] (Primary) Chief Complaint Eye Pain Reason for Call Symptomatic / Request for Health Information Initial Comment Caller woke up and her right eyelid is swollen and discolored. Translation No Nurse Assessment Nurse: Rufina Falco, RN, Deb Date/Time (Eastern Time): 10/02/2021 6:52:22 AM Confirm and document reason for call. If symptomatic, describe symptoms. ---Caller woke up and her right eyelid is swollen and discolored. Does the patient have any new or worsening symptoms? ---Yes Will a triage be completed? ---Yes Related visit to physician within the last 2 weeks? ---N/A Does the PT have any chronic conditions? (i.e. diabetes, asthma, this includes High risk factors for pregnancy, etc.) ---No Is the patient pregnant or possibly pregnant? (Ask all females between the ages of 21-55) ---No Is this a behavioral health or substance abuse call? ---No Guidelines Guideline Title Affirmed Question Affirmed Notes Nurse Date/Time (Eastern Time) Eye - Swelling MODERATESEVERE eyelid swelling on one side (Exception: due to a mosquito bite) Rufina Falco, RN, Deb 10/02/2021 6:53:55 AM Disp. Time Eilene Ghazi Time) Disposition Final User 10/02/2021 6:56:52 AM See PCP within 24 Hours Yes Rufina Falco, Therapist, sports, [employer]

## 2021-10-02 NOTE — Progress Notes (Signed)
Acute Office Visit  Subjective:    Patient ID: Nicole Kaiser, female    DOB: 11-10-1985, 36 y.o.   MRN: [id number]  Chief Complaint  Patient presents with   Belepharitis    Right eye lid. Started this morning. Pt says the eye was burning last night. This has occurred before, this is normally followed by the skin peeling from the lid thereafter. She has not worn any false lashes in the last 24 hours.    HPI Patient is in today for right eye swelling and discoloration.   Patient reports that last night while cooking dinner she noticed a slight burning/irritated sensation to her right upper lid.  This morning she woke up with her right eye very swollen at the upper lid and a slight gritty sensation in the eye.  She reports degree sensation quickly resolved and she did notice some increased tearing but otherwise no drainage or pain.  Reports that this has happened a couple times in the past and she has never figure out what it was.  She remembers getting some sort of drops at one point but cannot remember what they were.  She states in the past the discoloration to the upper lid would eventually have skin flaking followed by scabbing and soreness.  States she saw a dermatologist for this at one point but at the time she was pregnant so they did not give her any new medications.  She has not followed up with any providers regarding this since then.  Reports this episode is unprovoked.  She last wore artificial eyelashes 2 days ago and has not struggled with these in the past.  She denies any pain, drainage, redness, vision changes.  She would like a referral to an eye doctor to see why this keeps happening occasionally.       Past Medical History:  Diagnosis Date   Cervical dysplasia 2013   Normal follow up paps   Chicken pox    History of UTI    Hypertension affecting pregnancy    Vaginal Pap smear, abnormal     Past Surgical History:  Procedure Laterality Date   CESAREAN SECTION   02/14/2016   colposopy      Family History  Problem Relation Age of Onset   Alcoholism Father    Diabetes Maternal Grandmother    Cancer Neg Hx    Hypertension Neg Hx    Stroke Neg Hx     Social History   Socioeconomic History   Marital status: Single    Spouse name: Not on file   Number of children: 1   Years of education: Not on file   Highest education level: Not on file  Occupational History   Not on file  Tobacco Use   Smoking status: Never   Smokeless tobacco: Never  Vaping Use   Vaping Use: Never used  Substance and Sexual Activity   Alcohol use: Yes    Alcohol/week: 1.0 standard drink    Types: 1 Glasses of wine per week   Drug use: No   Sexual activity: Yes    Birth control/protection: Condom  Other Topics Concern   Not on file  Social History Narrative   Not on file   Social Determinants of Health   Financial Resource Strain: Not on file  Food Insecurity: Not on file  Transportation Needs: Not on file  Physical Activity: Not on file  Stress: Not on file  Social Connections: Not on file  Intimate Partner Violence:  Not on file    Outpatient Medications Prior to Visit  Medication Sig Dispense Refill   fluconazole (DIFLUCAN) 150 MG tablet Take 1 tablet (150 mg total) by mouth daily. 2 tablet 0   metroNIDAZOLE (FLAGYL) 500 MG tablet Take 1 tablet (500 mg total) by mouth 2 (two) times daily. 14 tablet 0   Norethindrone Acetate-Ethinyl Estrad-FE (LOESTRIN 24 FE) 1-20 MG-MCG(24) tablet Take 1 tablet by mouth daily. 1 Package 11   tranexamic acid (LYSTEDA) 650 MG TABS tablet Take 2 tablets (1,300 mg total) by mouth 3 (three) times daily. Take during menses for a maximum of five days (Patient not taking: Reported on 01/24/2020) 30 tablet 3   No facility-administered medications prior to visit.    No Known Allergies  Review of Systems All review of systems negative except what is listed in the HPI     Objective:    Physical Exam Vitals reviewed.   HENT:     Head: Normocephalic and atraumatic.  Eyes:     General:        Right eye: No foreign body, discharge or hordeolum.        Left eye: No foreign body, discharge or hordeolum.     Conjunctiva/sclera: Conjunctivae normal.     Comments: Right upper lid with edema and darkening/discoloration with small blister-type area in the center (see picture); very minimal crusting to upper lashes, but not to the extent of blepharitis, no drainage, no visible hordeolum or chalazion  Neurological:     General: No focal deficit present.     Mental Status: She is alert and oriented to person, place, and time.  Psychiatric:        Mood and Affect: Mood normal.        Behavior: Behavior normal.        Thought Content: Thought content normal.        Judgment: Judgment normal.       BP 122/60 (BP Location: Left Arm, Patient Position: Sitting, Cuff Size: Normal)    Pulse 73    Temp 98.3 F (36.8 C) (Oral)    Resp 18    Ht 5\' 1"  (1.549 m)    Wt 188 lb 14.4 oz (85.7 kg)    SpO2 98%    BMI 35.69 kg/m  Wt Readings from Last 3 Encounters:  10/02/21 188 lb 14.4 oz (85.7 kg)  01/29/20 168 lb 3.4 oz (76.3 kg)  01/24/20 167 lb (75.8 kg)    Health Maintenance Due  Topic Date Due   COVID-19 Vaccine (1) Never done   Hepatitis C Screening  Never done   TETANUS/TDAP  Never done   INFLUENZA VACCINE  Never done    There are no preventive care reminders to display for this patient.   Lab Results  Component Value Date   TSH 0.74 01/24/2020   Lab Results  Component Value Date   WBC 5.7 01/24/2020   HGB 11.2 (L) 01/24/2020   HCT 33.8 (L) 01/24/2020   MCV 93.2 01/24/2020   PLT 328.0 01/24/2020   Lab Results  Component Value Date   NA 137 01/24/2020   K 3.7 01/24/2020   CO2 29 01/24/2020   GLUCOSE 82 01/24/2020   BUN 13 01/24/2020   CREATININE 0.76 01/24/2020   BILITOT 0.6 01/24/2020   ALKPHOS 53 01/24/2020   AST 13 01/24/2020   ALT 6 01/24/2020   PROT 7.4 01/24/2020   ALBUMIN 4.3  01/24/2020   CALCIUM 9.3 01/24/2020   GFR 106.81  01/24/2020   Lab Results  Component Value Date   CHOL 185 01/24/2020   Lab Results  Component Value Date   HDL 38.00 (L) 01/24/2020   Lab Results  Component Value Date   LDLCALC 131 (H) 01/24/2020   Lab Results  Component Value Date   TRIG 80.0 01/24/2020   Lab Results  Component Value Date   CHOLHDL 5 01/24/2020   Lab Results  Component Value Date   HGBA1C 6.0 01/24/2020       Assessment & Plan:   1. Eye swelling, right Start with the drops and use warm compresses a few times per day Since we are going into the weekend I will go ahead and give you an oral antibiotic - use this if you do not start improving after a day or two of the drops. If you develop pain, redness, or vision changes, you need to see an eye doctor - if on the weekend you may have to go to the ED.  Adding an eye doctor referral at your request.   - tobramycin (TOBREX) 0.3 % ophthalmic solution; Place 2 drops into the right eye every 4 (four) hours for 7 days.  Dispense: 5 mL; Refill: 0 - amoxicillin-clavulanate (AUGMENTIN) 875-125 MG tablet; Take 1 tablet by mouth 2 (two) times daily for 7 days.  Dispense: 14 tablet; Refill: 0 - Ambulatory referral to Ophthalmology    Follow-up if symptoms worsen or fail to improve.   Terrilyn Saver, NP

## 2021-10-02 NOTE — Patient Instructions (Signed)
Start with the drops and use warm compresses a few times per day Since we are going into the weekend I will go ahead and give you an oral antibiotic - use this if you do not start improving after a day or two of the drops. If you develop pain, redness, or vision changes, you need to see an eye doctor - if on the weekend you may have to go to the ED.

## 2021-10-06 ENCOUNTER — Ambulatory Visit: Payer: Managed Care, Other (non HMO) | Admitting: Obstetrics and Gynecology

## 2021-10-06 NOTE — Progress Notes (Deleted)
GYNECOLOGY ANNUAL PREVENTATIVE CARE ENCOUNTER NOTE  History:     Nicole Kaiser is a 36 y.o. G1P1 female here for a routine annual gynecologic exam.    Current complaints: ***.     Recent BV pos on 1/9.   Gynecologic History No LMP recorded. Contraception: {method:5051} Last Pap: 01/2020. Result was normal with normal HPV Last Mammogram: Not yet indicated  Obstetric History OB History  Gravida Para Term Preterm AB Living  1 1          SAB IAB Ectopic Multiple Live Births          1    # Outcome Date GA Lbr Len/2nd Weight Sex Delivery Anes PTL Lv  1 Para    5 lb 2 oz (2.325 kg) M CS-Unspec       Past Medical History:  Diagnosis Date   Cervical dysplasia 2013   Normal follow up paps   Chicken pox    History of UTI    Hypertension affecting pregnancy    Vaginal Pap smear, abnormal     Past Surgical History:  Procedure Laterality Date   CESAREAN SECTION  02/14/2016   colposopy      Current Outpatient Medications on File Prior to Visit  Medication Sig Dispense Refill   amoxicillin-clavulanate (AUGMENTIN) 875-125 MG tablet Take 1 tablet by mouth 2 (two) times daily for 7 days. 14 tablet 0   fluconazole (DIFLUCAN) 150 MG tablet Take 1 tablet (150 mg total) by mouth daily. 2 tablet 0   tobramycin (TOBREX) 0.3 % ophthalmic solution Place 2 drops into the right eye every 4 (four) hours for 7 days. 5 mL 0   No current facility-administered medications on file prior to visit.    No Known Allergies  Social History:  reports that she has never smoked. She has never used smokeless tobacco. She reports current alcohol use of about 1.0 standard drink per week. She reports that she does not use drugs.  Family History  Problem Relation Age of Onset   Alcoholism Father    Diabetes Maternal Grandmother    Cancer Neg Hx    Hypertension Neg Hx    Stroke Neg Hx     The following portions of the patient's history were reviewed and updated as appropriate: allergies, current  medications, past family history, past medical history, past social history, past surgical history and problem list.  Review of Systems Pertinent items noted in HPI and remainder of comprehensive ROS otherwise negative.  Physical Exam:  There were no vitals taken for this visit. CONSTITUTIONAL: Well-developed, well-nourished female in no acute distress.  HENT:  Normocephalic, atraumatic, External right and left ear normal.  EYES: Conjunctivae and EOM are normal. Pupils are equal, round, and reactive to light. No scleral icterus.  NECK: Normal range of motion, supple, no masses.  Normal thyroid.  SKIN: Skin is warm and dry. No rash noted. Not diaphoretic. No erythema. No pallor. MUSCULOSKELETAL: Normal range of motion. No tenderness.  No cyanosis, clubbing, or edema. NEUROLOGIC: Alert and oriented to person, place, and time. Normal reflexes, muscle tone coordination.  PSYCHIATRIC: Normal mood and affect. Normal behavior. Normal judgment and thought content.  CARDIOVASCULAR: Normal heart rate noted, regular rhythm RESPIRATORY: Clear to auscultation bilaterally. Effort and breath sounds normal, no problems with respiration noted.  BREASTS: Symmetric in size. No masses, tenderness, skin changes, nipple drainage, or lymphadenopathy bilaterally. Performed in the presence of a chaperone. ABDOMEN: Soft, no distention noted.  No tenderness, rebound or  guarding.  PELVIC: {PELVIC:22980}. Performed in the presence of a chaperone.  Assessment and Plan:    1. Encounter for annual routine gynecological examination - Cervical cancer screening: Discussed guidelines. Pap with HPV done *** - Gardasil: {Blank single:19197::"completed","has not yet had. Counseling provided and she declines","Has not yet had. Counseling provided and pt accepts"} - GC/CT: {Blank single:19197::"accepts","declines","not indicated"} - Birth Control: Discussed options and their risks, benefits, and common side effects; discussed  VTE with estrogen containing options - desires: {Birth control type:23956} - Breast Health: Encouraged self breast awareness/SBE. Teaching provided.  - F/U 12 months and prn       Routine preventative health maintenance measures emphasized. Please refer to After Visit Summary for other counseling recommendations.   Radene Gunning, MD, [place] for [facility], [place]

## 2021-12-09 NOTE — Patient Instructions (Signed)
It was great to see you again today, I will be in touch with your labs 

## 2021-12-09 NOTE — Progress Notes (Deleted)
Therapist, music at [facility] ?[address], [address] ?[place], [place] [address] ?336 [phone] ?Fax [phone] ? ?Date:  12/15/2021  ? ?Name:  Ladajah Soltys   DOB:  [birth year]   MRN:  [id number] ? ?PCP:  Darreld Mclean, MD  ? ? ?Chief Complaint: No chief complaint on file. ? ? ?History of Present Illness: ? ?Euleta Belson is a 36 y.o. very pleasant female patient who presents with the following: ? ?Patient seen today for physical exam ?Most recent visit with myself was in 2021 ?She did see my partner Lovena Le Beck/NP in January for an eye concern ? ?She is generally in good health, history of uterine fibroids and prediabetes ?Gave birth to a son in 5364, birth complicated by emergency C-section and preeclampsia ? ?She works for [employer] ? ?COVID-19 series ?Hepatitis C screening can be updated ?Tetanus vaccine ?Pap is up-to-date-/21, negative with negative HPV ? ?Patient Active Problem List  ? Diagnosis Date Noted  ? Prediabetes 01/23/2020  ? History of uterine fibroid 11/11/2016  ? History of iron deficiency anemia 11/11/2016  ? Menorrhagia with regular cycle 11/11/2016  ? ? ?Past Medical History:  ?Diagnosis Date  ? Cervical dysplasia 2013  ? Normal follow up paps  ? Chicken pox   ? History of UTI   ? Hypertension affecting pregnancy   ? Vaginal Pap smear, abnormal   ? ? ?Past Surgical History:  ?Procedure Laterality Date  ? CESAREAN SECTION  02/14/2016  ? colposopy    ? ? ?Social History  ? ?Tobacco Use  ? Smoking status: Never  ? Smokeless tobacco: Never  ?Vaping Use  ? Vaping Use: Never used  ?Substance Use Topics  ? Alcohol use: Yes  ?  Alcohol/week: 1.0 standard drink  ?  Types: 1 Glasses of wine per week  ? Drug use: No  ? ? ?Family History  ?Problem Relation Age of Onset  ? Alcoholism Father   ? Diabetes Maternal Grandmother   ? Cancer Neg Hx   ? Hypertension Neg Hx   ? Stroke Neg Hx   ? ? ?No Known Allergies ? ?Medication list has been reviewed and updated. ? ?Current Outpatient  Medications on File Prior to Visit  ?Medication Sig Dispense Refill  ? fluconazole (DIFLUCAN) 150 MG tablet Take 1 tablet (150 mg total) by mouth daily. 2 tablet 0  ? ?No current facility-administered medications on file prior to visit.  ? ? ?Review of Systems: ? ?As per HPI- otherwise negative. ? ? ?Physical Examination: ?There were no vitals filed for this visit. ?There were no vitals filed for this visit. ?There is no height or weight on file to calculate BMI. ?Ideal Body Weight:   ? ?GEN: no acute distress. ?HEENT: Atraumatic, Normocephalic.  ?Ears and Nose: No external deformity. ?CV: RRR, No M/G/R. No JVD. No thrill. No extra heart sounds. ?PULM: CTA B, no wheezes, crackles, rhonchi. No retractions. No resp. distress. No accessory muscle use. ?ABD: S, NT, ND, +BS. No rebound. No HSM. ?EXTR: No c/c/e ?PSYCH: Normally interactive. Conversant.  ? ? ?Assessment and Plan: ?*** ?Physical exam today.  Encouraged healthy diet and exercise routine ?Will plan further follow- up pending labs. ? ?Signed ?Lamar Blinks, MD ? ?

## 2021-12-10 ENCOUNTER — Ambulatory Visit: Payer: No Typology Code available for payment source

## 2021-12-10 ENCOUNTER — Telehealth: Payer: No Typology Code available for payment source | Admitting: Urgent Care

## 2021-12-10 DIAGNOSIS — B3731 Acute candidiasis of vulva and vagina: Secondary | ICD-10-CM

## 2021-12-10 MED ORDER — FLUCONAZOLE 150 MG PO TABS: ORAL_TABLET | ORAL | 0 refills | Status: DC

## 2021-12-10 NOTE — Progress Notes (Signed)
?Virtual Visit Consent  ? ?Nicole Kaiser, you are scheduled for a virtual visit with a [facility] provider today.   ?  ?Just as with appointments in the office, your consent must be obtained to participate.  Your consent will be active for this visit and any virtual visit you may have with one of our providers in the next 365 days.   ?  ?If you have a MyChart account, a copy of this consent can be sent to you electronically.  All virtual visits are billed to your insurance company just like a traditional visit in the office.   ? ?As this is a virtual visit, video technology does not allow for your provider to perform a traditional examination.  This may limit your provider's ability to fully assess your condition.  If your provider identifies any concerns that need to be evaluated in person or the need to arrange testing (such as labs, EKG, etc.), we will make arrangements to do so.   ?  ?Although advances in technology are sophisticated, we cannot ensure that it will always work on either your end or our end.  If the connection with a video visit is poor, the visit may have to be switched to a telephone visit.  With either a video or telephone visit, we are not always able to ensure that we have a secure connection.    ? ?I need to obtain your verbal consent now.   Are you willing to proceed with your visit today?  ?  ?Nicole Kaiser has provided verbal consent on 12/10/2021 for a virtual visit (video or telephone). ?  ?Nicole Zara L Khadejah Son, PA  ? ?Date: 12/10/2021 7:29 PM ? ? ?Virtual Visit via Video Note  ? ?I, [place], connected with  Nicole Kaiser  ([id number], 12-10-1985) on 12/10/21 at  7:15 PM EDT by a video-enabled telemedicine application and verified that I am speaking with the correct person using two identifiers. ? ?Location: ?Patient: Virtual Visit Location Patient: Home ?Provider: Virtual Visit Location Provider: Home Office ?  ?I discussed the limitations of evaluation and management by telemedicine and the  availability of in person appointments. The patient expressed understanding and agreed to proceed.   ? ?History of Present Illness: ?Nicole Kaiser is a 36 y.o. who identifies as a female who was assigned female at birth, and is being seen today for concerns of vaginal itching. ? ?HPI: HPI Pt c/o a 3 day hx of vaginal itching to her external labia. She started her menstrual period one week ago. She has had yeast infections in the past and states they seem to follow her menses. She also recently got back from [place] and states she was using different soaps and shampoos. She denies any vaginal discharge and states there is itching and white clumps, but all external around the labia and clitoris. She denies pelvic pain, dysuria, urinary frequency, dyspareunia, fever, N/V, open sores or lesions. She has not tried any OTC medications. She otherwise feels at her baseline health. She is not diabetic.  ? ? ?Problems:  ?Patient Active Problem List  ? Diagnosis Date Noted  ? Prediabetes 01/23/2020  ? History of uterine fibroid 11/11/2016  ? History of iron deficiency anemia 11/11/2016  ? Menorrhagia with regular cycle 11/11/2016  ?  ?Allergies: No Known Allergies ?Medications:  ?Current Outpatient Medications:  ?  fluconazole (DIFLUCAN) 150 MG tablet, Take one tab PO x 1 dose now. May repeat in 72 hours as needed, Disp: 2 tablet, Rfl:  0 ? ?Observations/Objective: ?Patient is well-developed, well-nourished in no acute distress.  ?Resting comfortably sitting upright at home.  ?Head is normocephalic, atraumatic.  ?No labored breathing. No URI symptoms ?Speech is clear and coherent with logical content.  ?Patient is alert and oriented at baseline.  ?Pt defers evaluation of groin via telemedicine ? ?Assessment and Plan: ?1. Candidal vaginitis ? ?Pt symptoms are consistent with external candidiasis, likely related to recent menses and change of daily personal hygiene products. Pt admits to hx of vaginal yeast infections in the past and  admits to this feeling similar. Will do PO diflucan x 1 tab. A second one can be taken in 72 hours if sx have not resolved. Any new symptoms such as development of vaginal discharge or pelvic pain may warrant an in office visit. ? ?Follow Up Instructions: ?I discussed the assessment and treatment plan with the patient. The patient was provided an opportunity to ask questions and all were answered. The patient agreed with the plan and demonstrated an understanding of the instructions.  A copy of instructions were sent to the patient via MyChart unless otherwise noted below.  ? ? ?The patient was advised to call back or seek an in-person evaluation if the symptoms worsen or if the condition fails to improve as anticipated. ? ?Time:  ?I spent 8 minutes with the patient via telehealth technology discussing the above problems/concerns.   ? ?Sanyla Summey L Eliazar Olivar, PA  ?

## 2021-12-10 NOTE — Patient Instructions (Signed)
?  Nicole Kaiser, thank you for joining Chaney Malling, PA for today's virtual visit.  While this provider is not your primary care provider (PCP), if your PCP is located in our provider database this encounter information will be shared with them immediately following your visit. ? ?Consent: ?(Patient) Nicole Kaiser provided verbal consent for this virtual visit at the beginning of the encounter. ? ?Current Medications: ? ?Current Outpatient Medications:  ?  fluconazole (DIFLUCAN) 150 MG tablet, Take one tab PO x 1 dose now. May repeat in 72 hours as needed, Disp: 2 tablet, Rfl: 0  ? ?Medications ordered in this encounter:  ?Meds ordered this encounter  ?Medications  ? fluconazole (DIFLUCAN) 150 MG tablet  ?  Sig: Take one tab PO x 1 dose now. May repeat in 72 hours as needed  ?  Dispense:  2 tablet  ?  Refill:  0  ?  Order Specific Question:   Supervising Provider  ?  Answer:   Noemi Chapel [[id number]]  ?  ? ?*If you need refills on other medications prior to your next appointment, please contact your pharmacy* ? ?Follow-Up: ?Call back or seek an in-person evaluation if the symptoms worsen or if the condition fails to improve as anticipated. ? ?Other Instructions ?You have been treated today for a vaginal yeast infection. ?Take 1 tablet of diflucan x 1 dose. May repeat in 72 hours if needed. ?Eating yogurt or probiotics can also help restore the normal flora in your vaginal area. ?Please monitor for any new or worsening symptoms to include pelvic pain, worsening itching or discharge, which may require an in office visit. ? ? ?If you have been instructed to have an in-person evaluation today at a local Urgent Care facility, please use the link below. It will take you to a list of all of our available [facility], including address, phone number and hours of operation. Please do not delay care.  ?[facility] ? ?If you or a family member do not have a primary care provider, use the link below to  schedule a visit and establish care. When you choose a [facility] primary care physician or advanced practice provider, you gain a long-term partner in health. ?Find a Primary Care Provider ? ?Learn more about 's in-office and virtual care options: ?[facility]  ?

## 2021-12-11 ENCOUNTER — Ambulatory Visit: Payer: Self-pay

## 2021-12-15 ENCOUNTER — Telehealth: Payer: Self-pay | Admitting: Family Medicine

## 2021-12-15 ENCOUNTER — Encounter: Payer: Self-pay | Admitting: Family Medicine

## 2021-12-15 ENCOUNTER — Ambulatory Visit (INDEPENDENT_AMBULATORY_CARE_PROVIDER_SITE_OTHER): Payer: Self-pay | Admitting: Family Medicine

## 2021-12-15 DIAGNOSIS — Z13220 Encounter for screening for lipoid disorders: Secondary | ICD-10-CM

## 2021-12-15 DIAGNOSIS — Z1159 Encounter for screening for other viral diseases: Secondary | ICD-10-CM

## 2021-12-15 DIAGNOSIS — R7303 Prediabetes: Secondary | ICD-10-CM

## 2021-12-15 DIAGNOSIS — Z1329 Encounter for screening for other suspected endocrine disorder: Secondary | ICD-10-CM

## 2021-12-15 DIAGNOSIS — Z91199 Patient's noncompliance with other medical treatment and regimen due to unspecified reason: Secondary | ICD-10-CM

## 2021-12-15 DIAGNOSIS — Z0000 Encounter for general adult medical examination without abnormal findings: Secondary | ICD-10-CM

## 2021-12-15 DIAGNOSIS — Z130 Encounter for screening for diseases of the blood and blood-forming organs and certain disorders involving the immune mechanism: Secondary | ICD-10-CM

## 2021-12-15 NOTE — Telephone Encounter (Signed)
Patient called to inform she missed appt today for eye swollen/and blurred vision.(Last seen by PCP in January for this). She was afraid to drive today due to vision being blurry. ?Was seen by a specialist twice and was told she would need a biopsy of eyelid  to find out what was going on. ?Wanted to know what PCP wants her to do. ?See PCP or go back to a specialist? ?

## 2021-12-15 NOTE — Telephone Encounter (Signed)
See below

## 2021-12-16 NOTE — Progress Notes (Signed)
No show

## 2022-01-13 NOTE — Patient Instructions (Addendum)
It was good to see you again today, I will be in touch with your labs- please schedule a lab visit only at your convenience  ?We will get you set up to see an allergist regarding your allergic reaction symptoms ?Recommend covid series if not done already  ? ? ?

## 2022-01-13 NOTE — Progress Notes (Addendum)
Therapist, music at [facility] ?[address], [address] ?[place], [place] [address] ?336 [phone] ?Fax [phone] ? ?Date:  01/15/2022  ? ?Name:  Nicole Kaiser   DOB:  10-Mar-1986   MRN:  [id number] ? ?PCP:  Darreld Mclean, MD  ? ? ?Chief Complaint: Annual Exam (Concerns/ questions: 1. Pt still has concerns about the discoloration that was left on her eye lid after it was swollen in January. 2. She wanted you to know she was seen by GYN today and to look out for pap and Prolactin results. /Hep C screen due) ? ? ?History of Present Illness: ? ?Nicole Kaiser is a 36 y.o. very pleasant female patient who presents with the following: ? ?Patient seen today for physical exam ?History of prediabetes, menorrhagia and fibroids ?Last seen by myself about 2 years ago, she was a no-show last month ? ?Pap done 2021, normal with negative HPV- this was just updated today, she was seen by her GYN just prior to our visit   ?She is still having a little milky discharge from her left nipple- her GYN checked a prolactin level for her today which is pending  ?Her "baby" is now 6! He will be in 1st grade this fall ?Due for blood work today- however she prefers to come back fasting another day  ? ?In January she had a possible allergic reaction to diflucan- she took it and 10 minutes later her right eye swelled up, the lid eventually scabbed and since then she has noted a well-circumscribed area of hyperpigmentation around the eye, it gives her the appearance of having a black eye. ?She is seeing what sounds like a dermatology/plastic surgery clinic who recommended doing an eyelid biopsy for definitive diagnosis ? ?She notes approximately 7 similar allergic reactions, also with swelling of her eye but never with wheezing, other hives or shortness of breath- over the last 10 years.  She is not sure what she could be allergic to ?Patient Active Problem List  ? Diagnosis Date Noted  ? Prediabetes 01/23/2020  ? History of uterine  fibroid 11/11/2016  ? History of iron deficiency anemia 11/11/2016  ? Menorrhagia with regular cycle 11/11/2016  ? ? ?Past Medical History:  ?Diagnosis Date  ? Cervical dysplasia 2013  ? Normal follow up paps  ? Chicken pox   ? History of UTI   ? Hypertension affecting pregnancy   ? Vaginal Pap smear, abnormal   ? ? ?Past Surgical History:  ?Procedure Laterality Date  ? CESAREAN SECTION  02/14/2016  ? colposopy    ? ? ?Social History  ? ?Tobacco Use  ? Smoking status: Never  ? Smokeless tobacco: Never  ?Vaping Use  ? Vaping Use: Never used  ?Substance Use Topics  ? Alcohol use: Yes  ?  Alcohol/week: 1.0 standard drink  ?  Types: 1 Glasses of wine per week  ? Drug use: No  ? ? ?Family History  ?Problem Relation Age of Onset  ? Alcoholism Father   ? Diabetes Maternal Grandmother   ? Cancer Neg Hx   ? Hypertension Neg Hx   ? Stroke Neg Hx   ? ? ?Allergies  ?Allergen Reactions  ? Diflucan [Fluconazole]   ?  Possible allergic reaction with eye swelling only   ? Metronidazole Swelling  ? ? ?Medication list has been reviewed and updated. ? ?No current outpatient medications on file prior to visit.  ? ?No current facility-administered medications on file prior to visit.  ? ? ?  Review of Systems: ? ?As per HPI- otherwise negative. ? ? ?Physical Examination: ?Vitals:  ? 01/15/22 0922  ?BP: 122/80  ?Pulse: 60  ?Resp: 18  ?Temp: 98.2 ?F (36.8 ?C)  ?SpO2: 99%  ? ?Vitals:  ? 01/15/22 0922  ?Weight: 180 lb 6.4 oz (81.8 kg)  ?Height: '5\' 1"'$  (1.549 m)  ? ?Body mass index is 34.09 kg/m?. ?Ideal Body Weight: Weight in (lb) to have BMI = 25: 132 ? ?GEN: no acute distress. Obese, looks well  ?HEENT: Atraumatic, Normocephalic.  Bilateral TM wnl, oropharynx normal.  PEERL,EOMI.   ?There is a clearly demarcated, well-circumscribed area of hyperpigmentation surrounding her right eye; skin appears otherwise normal ?Ears and Nose: No external deformity. ?CV: RRR, No M/G/R. No JVD. No thrill. No extra heart sounds. ?PULM: CTA B, no wheezes,  crackles, rhonchi. No retractions. No resp. distress. No accessory muscle use. ?ABD: S, NT, ND, No rebound. No HSM. ?EXTR: No c/c/e ?PSYCH: Normally interactive. Conversant.  ?Thyroid is mildly enlarged on exam  ? ? ?Assessment and Plan: ?Physical exam ? ?Prediabetes - Plan: Comprehensive metabolic panel, Hemoglobin A1c ? ?Screening for hyperlipidemia - Plan: Lipid panel ? ?Screening for thyroid disorder - Plan: TSH ? ?Screening for deficiency anemia - Plan: CBC, Ferritin ? ?Encounter for hepatitis C screening test for low risk patient - Plan: Hepatitis C antibody ? ?Allergic reaction, initial encounter - Plan: Ambulatory referral to Allergy ? ?Wheezing - Plan: albuterol (VENTOLIN HFA) 108 (90 Base) MCG/ACT inhaler ? ?Thyroid enlargement - Plan: US THYROID ? ?Physical exam today.  Encouraged healthy diet and exercise routine ?Will plan further follow- up pending labs. ?Patient uses albuterol on rare occasion for wheezing, mostly with pollen.  Refilled albuterol ?Referral to allergy for evaluation of possible recurrent allergic reaction ?Advised patient that eyelid biopsy would potentially give her a definitive diagnosis for the hyperpigmentation she has noticed. ? ?Signed ?Lamar Blinks, MD ? ?

## 2022-01-15 ENCOUNTER — Other Ambulatory Visit (HOSPITAL_BASED_OUTPATIENT_CLINIC_OR_DEPARTMENT_OTHER): Payer: Self-pay

## 2022-01-15 ENCOUNTER — Encounter: Payer: Self-pay | Admitting: Family Medicine

## 2022-01-15 ENCOUNTER — Other Ambulatory Visit (HOSPITAL_COMMUNITY)
Admission: RE | Admit: 2022-01-15 | Discharge: 2022-01-15 | Disposition: A | Discharge status: Home / Self Care | Payer: No Typology Code available for payment source | Source: Ambulatory Visit | Attending: Family Medicine | Admitting: Family Medicine

## 2022-01-15 ENCOUNTER — Ambulatory Visit (INDEPENDENT_AMBULATORY_CARE_PROVIDER_SITE_OTHER): Payer: No Typology Code available for payment source | Admitting: Family Medicine

## 2022-01-15 VITALS — BP 126/74 | HR 67 | Ht 61.0 in | Wt 180.0 lb

## 2022-01-15 VITALS — BP 122/80 | HR 60 | Temp 98.2°F | Resp 18 | Ht 61.0 in | Wt 180.4 lb

## 2022-01-15 DIAGNOSIS — Z1329 Encounter for screening for other suspected endocrine disorder: Secondary | ICD-10-CM | POA: Diagnosis not present

## 2022-01-15 DIAGNOSIS — Z01419 Encounter for gynecological examination (general) (routine) without abnormal findings: Secondary | ICD-10-CM

## 2022-01-15 DIAGNOSIS — Z130 Encounter for screening for diseases of the blood and blood-forming organs and certain disorders involving the immune mechanism: Secondary | ICD-10-CM

## 2022-01-15 DIAGNOSIS — Z0000 Encounter for general adult medical examination without abnormal findings: Secondary | ICD-10-CM

## 2022-01-15 DIAGNOSIS — R7303 Prediabetes: Secondary | ICD-10-CM | POA: Diagnosis not present

## 2022-01-15 DIAGNOSIS — N643 Galactorrhea not associated with childbirth: Secondary | ICD-10-CM | POA: Diagnosis not present

## 2022-01-15 DIAGNOSIS — Z13220 Encounter for screening for lipoid disorders: Secondary | ICD-10-CM

## 2022-01-15 DIAGNOSIS — R062 Wheezing: Secondary | ICD-10-CM

## 2022-01-15 DIAGNOSIS — T7840XA Allergy, unspecified, initial encounter: Secondary | ICD-10-CM

## 2022-01-15 DIAGNOSIS — Z1159 Encounter for screening for other viral diseases: Secondary | ICD-10-CM

## 2022-01-15 DIAGNOSIS — E049 Nontoxic goiter, unspecified: Secondary | ICD-10-CM

## 2022-01-15 MED ORDER — ALBUTEROL SULFATE HFA 108 (90 BASE) MCG/ACT IN AERS
2.0000 | INHALATION_SPRAY | Freq: Four times a day (QID) | RESPIRATORY_TRACT | 3 refills | Status: DC | PRN
  Filled 2022-01-15: qty 6.7, 25d supply, fill #0
  Filled 2022-07-17: qty 6.7, 25d supply, fill #1

## 2022-01-15 NOTE — Progress Notes (Signed)
? ? ?GYNECOLOGY ANNUAL PREVENTATIVE CARE ENCOUNTER NOTE ? ?Subjective:  ? Nicole Kaiser is a 36 y.o. G1P1 female here for a routine annual gynecologic exam.  Current complaints: galactorrhea intermittently.   Denies abnormal vaginal bleeding, discharge, pelvic pain, problems with intercourse or other gynecologic concerns.  ?  ?Gynecologic History ?No LMP recorded (exact date). ?Patient is  sexually active  ?Contraception: condoms ?Last Pap: 2021. Results were: normal ?Last mammogram: n/a. ? ?The pregnancy intention screening data noted above was reviewed. Potential methods of contraception were discussed. The patient elected to proceed with No data recorded. ? ? ?Obstetric History ?OB History  ?Gravida Para Term Preterm AB Living  ?1 1          ?SAB IAB Ectopic Multiple Live Births  ?        1  ?  ?# Outcome Date GA Lbr Len/2nd Weight Sex Delivery Anes PTL Lv  ?1 Para    5 lb 2 oz (2.325 kg) M CS-Unspec     ? ? ?Past Medical History:  ?Diagnosis Date  ? Cervical dysplasia 2013  ? Normal follow up paps  ? Chicken pox   ? History of UTI   ? Hypertension affecting pregnancy   ? Vaginal Pap smear, abnormal   ? ? ?Past Surgical History:  ?Procedure Laterality Date  ? CESAREAN SECTION  02/14/2016  ? colposopy    ? ? ?Current Outpatient Medications on File Prior to Visit  ?Medication Sig Dispense Refill  ? fluconazole (DIFLUCAN) 150 MG tablet Take one tab PO x 1 dose now. May repeat in 72 hours as needed (Patient not taking: Reported on 01/15/2022) 2 tablet 0  ? ?No current facility-administered medications on file prior to visit.  ? ? ?Allergies  ?Allergen Reactions  ? Metronidazole Swelling  ? ? ?Social History  ? ?Socioeconomic History  ? Marital status: Single  ?  Spouse name: Not on file  ? Number of children: 1  ? Years of education: Not on file  ? Highest education level: Not on file  ?Occupational History  ? Not on file  ?Tobacco Use  ? Smoking status: Never  ? Smokeless tobacco: Never  ?Vaping Use  ? Vaping Use:  Never used  ?Substance and Sexual Activity  ? Alcohol use: Yes  ?  Alcohol/week: 1.0 standard drink  ?  Types: 1 Glasses of wine per week  ? Drug use: No  ? Sexual activity: Yes  ?  Birth control/protection: Condom  ?Other Topics Concern  ? Not on file  ?Social History Narrative  ? Not on file  ? ?Social Determinants of Health  ? ?Financial Resource Strain: Not on file  ?Food Insecurity: Not on file  ?Transportation Needs: Not on file  ?Physical Activity: Not on file  ?Stress: Not on file  ?Social Connections: Not on file  ?Intimate Partner Violence: Not on file  ? ? ?Family History  ?Problem Relation Age of Onset  ? Alcoholism Father   ? Diabetes Maternal Grandmother   ? Cancer Neg Hx   ? Hypertension Neg Hx   ? Stroke Neg Hx   ? ? ?The following portions of the patient's history were reviewed and updated as appropriate: allergies, current medications, past family history, past medical history, past social history, past surgical history and problem list. ? ?Review of Systems ?Pertinent items are noted in HPI. ?  ?Objective:  ?BP 126/74   Pulse 67   Ht '5\' 1"'$  (1.549 m)   Wt 180 lb (81.6  kg)   LMP  (Exact Date)   BMI 34.01 kg/m?  ?Wt Readings from Last 3 Encounters:  ?01/15/22 180 lb (81.6 kg)  ?10/02/21 188 lb 14.4 oz (85.7 kg)  ?01/29/20 168 lb 3.4 oz (76.3 kg)  ?  ? ?Chaperone present during exam ? ?CONSTITUTIONAL: Well-developed, well-nourished female in no acute distress.  ?HENT:  Normocephalic, atraumatic, External right and left ear normal. Oropharynx is clear and moist ?EYES: Conjunctivae and EOM are normal. Pupils are equal, round, and reactive to light. No scleral icterus.  ?NECK: Normal range of motion, supple, no masses.  Normal thyroid.  ? ?CARDIOVASCULAR: Normal heart rate noted, regular rhythm ?RESPIRATORY: Clear to auscultation bilaterally. Effort and breath sounds normal, no problems with respiration noted. ?BREASTS: Symmetric in size. No masses, skin changes, nipple drainage, or  lymphadenopathy. ?ABDOMEN: Soft, normal bowel sounds, no distention noted.  No tenderness, rebound or guarding.  ?PELVIC: Normal appearing external genitalia; normal appearing vaginal mucosa and cervix.  No abnormal discharge noted.  Normal uterine size, no other palpable masses, no uterine or adnexal tenderness. ?MUSCULOSKELETAL: Normal range of motion. No tenderness.  No cyanosis, clubbing, or edema.  2+ distal pulses. ?SKIN: Skin is warm and dry. No rash noted. Not diaphoretic. No erythema. No pallor. ?NEUROLOGIC: Alert and oriented to person, place, and time. Normal reflexes, muscle tone coordination. No cranial nerve deficit noted. ?PSYCHIATRIC: Normal mood and affect. Normal behavior. Normal judgment and thought content. ? ?Assessment:  ?Annual gynecologic examination with pap smear ?  ?Plan:  ?1. Well Woman Exam ?Will follow up results of pap smear and manage accordingly. ? ?2. Galactorrhea in female ?- Prolactin ? ? ?Routine preventative health maintenance measures emphasized. ?Please refer to After Visit Summary for other counseling recommendations.  ? ? ?Loma Boston, DO ?[facility] ? ?

## 2022-01-15 NOTE — Addendum Note (Signed)
Addended by: Lamar Blinks C on: 01/15/2022 02:10 PM ? ? Modules accepted: Orders ? ?

## 2022-01-15 NOTE — Progress Notes (Signed)
Patient reports she didn't have period last month.  ?Last Pap: 05/12/21Jennifer Nadara Mustard RN  ?

## 2022-01-15 NOTE — Addendum Note (Signed)
Addended by: Phill Myron on: 01/15/2022 09:26 AM ? ? Modules accepted: Orders ? ?

## 2022-01-16 ENCOUNTER — Encounter: Payer: Self-pay | Admitting: Family Medicine

## 2022-01-16 LAB — PROLACTIN: Prolactin: 18.6 ng/mL (ref 4.8–23.3)

## 2022-01-19 ENCOUNTER — Encounter: Payer: Self-pay | Admitting: Family Medicine

## 2022-01-19 ENCOUNTER — Other Ambulatory Visit (HOSPITAL_BASED_OUTPATIENT_CLINIC_OR_DEPARTMENT_OTHER): Payer: Self-pay

## 2022-01-22 ENCOUNTER — Other Ambulatory Visit (INDEPENDENT_AMBULATORY_CARE_PROVIDER_SITE_OTHER): Payer: No Typology Code available for payment source

## 2022-01-22 DIAGNOSIS — Z13220 Encounter for screening for lipoid disorders: Secondary | ICD-10-CM

## 2022-01-22 DIAGNOSIS — Z1329 Encounter for screening for other suspected endocrine disorder: Secondary | ICD-10-CM | POA: Diagnosis not present

## 2022-01-22 DIAGNOSIS — Z130 Encounter for screening for diseases of the blood and blood-forming organs and certain disorders involving the immune mechanism: Secondary | ICD-10-CM | POA: Diagnosis not present

## 2022-01-22 DIAGNOSIS — R7303 Prediabetes: Secondary | ICD-10-CM | POA: Diagnosis not present

## 2022-01-22 DIAGNOSIS — Z1159 Encounter for screening for other viral diseases: Secondary | ICD-10-CM

## 2022-01-22 LAB — CBC
HCT: 31.7 % — ABNORMAL LOW (ref 36.0–46.0)
Hemoglobin: 10.2 g/dL — ABNORMAL LOW (ref 12.0–15.0)
MCHC: 32.2 g/dL (ref 30.0–36.0)
MCV: 88.4 fl (ref 78.0–100.0)
Platelets: 359 10*3/uL (ref 150.0–400.0)
RBC: 3.59 Mil/uL — ABNORMAL LOW (ref 3.87–5.11)
RDW: 16.7 % — ABNORMAL HIGH (ref 11.5–15.5)
WBC: 5.2 10*3/uL (ref 4.0–10.5)

## 2022-01-22 LAB — LIPID PANEL
Cholesterol: 175 mg/dL (ref 0–200)
HDL: 40 mg/dL (ref >39.00)
LDL Cholesterol: 117 mg/dL — ABNORMAL HIGH (ref 0–99)
NonHDL: 135.28
Total CHOL/HDL Ratio: 4
Triglycerides: 91 mg/dL (ref 0.0–149.0)
VLDL: 18.2 mg/dL (ref 0.0–40.0)

## 2022-01-22 LAB — COMPREHENSIVE METABOLIC PANEL
ALT: 7 U/L (ref 0–35)
AST: 13 U/L (ref 0–37)
Albumin: 4.2 g/dL (ref 3.5–5.2)
Alkaline Phosphatase: 67 U/L (ref 39–117)
BUN: 12 mg/dL (ref 6–23)
CO2: 27 mEq/L (ref 19–32)
Calcium: 8.9 mg/dL (ref 8.4–10.5)
Chloride: 106 mEq/L (ref 96–112)
Creatinine, Ser: 0.82 mg/dL (ref 0.40–1.20)
GFR: 93.7 mL/min (ref >60.00)
Glucose, Bld: 93 mg/dL (ref 70–99)
Potassium: 4.1 mEq/L (ref 3.5–5.1)
Sodium: 138 mEq/L (ref 135–145)
Total Bilirubin: 0.3 mg/dL (ref 0.2–1.2)
Total Protein: 7.4 g/dL (ref 6.0–8.3)

## 2022-01-22 LAB — TSH: TSH: 1.09 u[IU]/mL (ref 0.35–5.50)

## 2022-01-22 LAB — HEPATITIS C ANTIBODY
Hepatitis C Ab: NONREACTIVE
SIGNAL TO CUT-OFF: 0.15 (ref <1.00)

## 2022-01-22 LAB — HEMOGLOBIN A1C: Hgb A1c MFr Bld: 6.2 % (ref 4.6–6.5)

## 2022-01-22 LAB — FERRITIN: Ferritin: 4.6 ng/mL — ABNORMAL LOW (ref 10.0–291.0)

## 2022-01-23 ENCOUNTER — Encounter: Payer: Self-pay | Admitting: Family Medicine

## 2022-01-23 NOTE — Progress Notes (Signed)
Received labs as below- message to pt ? ?Results for orders placed or performed in visit on 01/22/22  ?Hepatitis C antibody  ?Result Value Ref Range  ? Hepatitis C Ab NON-REACTIVE NON-REACTIVE  ? SIGNAL TO CUT-OFF 0.15 <1.00  ?Ferritin  ?Result Value Ref Range  ? Ferritin 4.6 (L) 10.0 - 291.0 ng/mL  ?TSH  ?Result Value Ref Range  ? TSH 1.09 0.35 - 5.50 uIU/mL  ?Lipid panel  ?Result Value Ref Range  ? Cholesterol 175 0 - 200 mg/dL  ? Triglycerides 91.0 0.0 - 149.0 mg/dL  ? HDL 40.00 >39.00 mg/dL  ? VLDL 18.2 0.0 - 40.0 mg/dL  ? LDL Cholesterol 117 (H) 0 - 99 mg/dL  ? Total CHOL/HDL Ratio 4   ? NonHDL 135.28   ?Hemoglobin A1c  ?Result Value Ref Range  ? Hgb A1c MFr Bld 6.2 4.6 - 6.5 %  ?Comprehensive metabolic panel  ?Result Value Ref Range  ? Sodium 138 135 - 145 mEq/L  ? Potassium 4.1 3.5 - 5.1 mEq/L  ? Chloride 106 96 - 112 mEq/L  ? CO2 27 19 - 32 mEq/L  ? Glucose, Bld 93 70 - 99 mg/dL  ? BUN 12 6 - 23 mg/dL  ? Creatinine, Ser 0.82 0.40 - 1.20 mg/dL  ? Total Bilirubin 0.3 0.2 - 1.2 mg/dL  ? Alkaline Phosphatase 67 39 - 117 U/L  ? AST 13 0 - 37 U/L  ? ALT 7 0 - 35 U/L  ? Total Protein 7.4 6.0 - 8.3 g/dL  ? Albumin 4.2 3.5 - 5.2 g/dL  ? GFR 93.70 >60.00 mL/min  ? Calcium 8.9 8.4 - 10.5 mg/dL  ?CBC  ?Result Value Ref Range  ? WBC 5.2 4.0 - 10.5 K/uL  ? RBC 3.59 (L) 3.87 - 5.11 Mil/uL  ? Platelets 359.0 150.0 - 400.0 K/uL  ? Hemoglobin 10.2 (L) 12.0 - 15.0 g/dL  ? HCT 31.7 (L) 36.0 - 46.0 %  ? MCV 88.4 78.0 - 100.0 fl  ? MCHC 32.2 30.0 - 36.0 g/dL  ? RDW 16.7 (H) 11.5 - 15.5 %  ? ? ?

## 2022-01-24 ENCOUNTER — Ambulatory Visit (HOSPITAL_BASED_OUTPATIENT_CLINIC_OR_DEPARTMENT_OTHER)
Admission: RE | Admit: 2022-01-24 | Discharge: 2022-01-24 | Disposition: A | Discharge status: Home / Self Care | Payer: No Typology Code available for payment source | Source: Ambulatory Visit | Attending: Family Medicine | Admitting: Family Medicine

## 2022-01-24 ENCOUNTER — Other Ambulatory Visit: Payer: Self-pay | Admitting: Family Medicine

## 2022-01-24 DIAGNOSIS — D509 Iron deficiency anemia, unspecified: Secondary | ICD-10-CM

## 2022-01-24 DIAGNOSIS — E049 Nontoxic goiter, unspecified: Secondary | ICD-10-CM | POA: Diagnosis present

## 2022-01-26 ENCOUNTER — Encounter: Payer: Self-pay | Admitting: Family Medicine

## 2022-01-26 LAB — CYTOLOGY - PAP
Adequacy: ABSENT
Comment: NEGATIVE
Diagnosis: NEGATIVE
Diagnosis: REACTIVE
High risk HPV: NEGATIVE

## 2022-04-01 ENCOUNTER — Ambulatory Visit: Payer: No Typology Code available for payment source | Admitting: Obstetrics & Gynecology

## 2022-04-01 ENCOUNTER — Encounter: Payer: Self-pay | Admitting: Obstetrics & Gynecology

## 2022-04-01 VITALS — BP 124/71 | HR 75 | Ht 61.0 in | Wt 175.0 lb

## 2022-04-01 DIAGNOSIS — N643 Galactorrhea not associated with childbirth: Secondary | ICD-10-CM

## 2022-04-01 DIAGNOSIS — N6321 Unspecified lump in the left breast, upper outer quadrant: Secondary | ICD-10-CM | POA: Diagnosis not present

## 2022-04-01 NOTE — Progress Notes (Signed)
Was seen on March 28 2022 for breast pain at [facility].  Patient noticed lump on left breast approx four days ago. Last pap May 2023. Kathrene Alu  RN

## 2022-04-01 NOTE — Patient Instructions (Signed)
Breast Cyst  A breast cyst is a sac in the breast that is filled with fluid. They are usually noncancerous (benign) and are common among women. Breast cysts are most often in the upper, outer portion of the breast. One or more cysts may develop. They form when fluid builds up inside the breast glands. There are several types of breast cysts. Some are too small to feel, but these can be seen with imaging tests such as an X-ray of the breast (mammogram) or ultrasound. Breast cysts do not increase your risk of breast cancer. They usually disappear after you no longer have a menstrual cycle (after menopause), unless you take artificial hormones (are on hormone therapy). What are the causes? This condition may be caused by: Blockage of tubes (ducts) in the breast glands, which leads to fluid buildup. Duct blockage may result from: Fibrocystic breast changes. This is a common, benign condition that occurs when women go through hormonal changes during the menstrual cycle. This is a common cause of multiple breast cysts. Overgrowth of breast tissue or breast glands. Scar tissue in the breast from previous surgery. Changes in certain female hormones (estrogen and progesterone). The exact cause of this condition is not known. What increases the risk? You may be more likely to develop breast cysts if you have not gone through menopause. What are the signs or symptoms? Symptoms of this condition include: Feeling one or more smooth, round, soft lumps (like grapes) in the breast that are easily movable. The lump or lumps may get bigger and more painful before your menstrual period and get smaller after your menstrual period. Breast discomfort or pain. How is this diagnosed? This condition may be diagnosed based on: A physical exam. A cyst can be felt by your health care provider during this exam. Imaging tests, such as mammogram or ultrasound. Fluid may be removed from the cyst with a needle (fine-needle  aspiration) and tested to make sure the cyst is not cancerous. How is this treated? Treatment may not be needed for this condition. Your health care provider may monitor the cyst to see if it goes away on its own. If the cyst is uncomfortable or gets bigger, or if you do not like how the cyst makes your breast look, you may need treatment. Treatment may include: Hormone therapy. Fine-needle aspiration to drain fluid from the cyst. There is a chance of the cyst coming back (recurring) after aspiration. Surgery to remove the cyst. Follow these instructions at home: Self-exams  Do a breast self-exam every month, or as often as directed. A breast self-exam involves: Comparing your breasts in the mirror. Looking for visible changes in your skin or nipples. Feeling for lumps or changes. Having many breast cysts may make it harder to feel for new lumps. Understand how your breasts normally look and feel, and write down any changes in your breasts. Tell your health care provider about any changes. Eating and drinking Follow instructions from your health care provider about eating and drinking restrictions. Drink enough fluid to keep your urine pale yellow. Avoid caffeine. Cut down on salt (sodium) in what you eat and drink, especially before your menstrual period. Too much sodium can cause fluid buildup, breast swelling, and discomfort. General instructions See your health care provider regularly. Get a yearly physical exam. If you are 22-85 years old, get a clinical breast exam every 1-3 years. After the age of 57 years, get this exam every year. Get mammograms as often as directed. Take  over-the-counter and prescription medicines only as told by your health care provider. Wear a supportive bra, especially when exercising. Keep all follow-up visits as told by your health care provider. This is important. Contact a health care provider if: You feel, or think you feel, a lump in your breast. You  notice that both breasts look or feel different than usual. Your breast is still causing pain after your menstrual period is over. You find new lumps or bumps that were not there before. You feel lumps in your armpit. Get help right away if: You have severe pain, tenderness, redness, or warmth in your breast. You have fluid or blood leaking from your nipple. Your breast lump becomes hard and painful. You notice dimpling or wrinkling of the breast or nipple. Summary A breast cyst is a sac in the breast that is filled with fluid. Treatment may not be needed for this condition. If the cyst is uncomfortable or gets bigger, or if you do not like how the cyst makes your breast look, you may need treatment. This information is not intended to replace advice given to you by your health care provider. Make sure you discuss any questions you have with your health care provider. Document Revised: 01/17/2019 Document Reviewed: 01/17/2019 Elsevier Patient Education  [place].

## 2022-04-01 NOTE — Progress Notes (Signed)
History:  36 y.o. G1P1 here today for eval of mass of left breast. Pt reports that she noted a mass in her left breast 3 days ago. She was seen in the ED while she was out of town due to pain and was given an atbx which she did not take as she was coming her for eval. She reports that since the birth of her son >5 years, she has had nipple discharge from her left nipple. She did not breastfeed her son. She has no FH of breast cancer. Her paternal GM had ovarian cancer. She never met her so does not know the details.    The following portions of the patient's history were reviewed and updated as appropriate: allergies, current medications, past family history, past medical history, past social history, past surgical history and problem list.  Review of Systems:  Pertinent items are noted in HPI.    Objective:  Physical Exam Blood pressure 124/71, pulse 75, height '5\' 1"'  (1.549 m), weight 175 lb (79.4 kg), last menstrual period 03/14/2022.  CONSTITUTIONAL: Well-developed, well-nourished female in no acute distress.  HENT:  Normocephalic, atraumatic EYES: Conjunctivae and EOM are normal. No scleral icterus.  NECK: Normal range of motion SKIN: Skin is warm and dry. No rash noted. Not diaphoretic.No pallor. [place]: Alert and oriented to person, place, and time. Normal coordination.  Left breast: mobile LA noted under the left axilla x1. There is a mobile mass behind the areola in the upper outer quad of the left breast. This is nontender. There is no fluctuance. No nipple discharge noted even with expression.      The right breast is WNL. No masses or LA      Assessment & Plan:   Diagnoses and all orders for this visit:  Mass of upper outer quadrant of left breast -     US BREAST LTD UNI LEFT INC AXILLA; Future -     MM Digital Diagnostic Unilat L; Future  Galactorrhea of left breast -     TSH -     Prolactin   F/u in 2 weeks post studies or sooner prn  Hildreth Robart L. Harraway-Smith,  M.D., Cherlynn June

## 2022-04-02 LAB — PROLACTIN: Prolactin: 25.5 ng/mL — ABNORMAL HIGH (ref 4.8–23.3)

## 2022-04-02 LAB — TSH: TSH: 1.22 u[IU]/mL (ref 0.450–4.500)

## 2022-04-08 ENCOUNTER — Other Ambulatory Visit: Payer: Self-pay | Admitting: Obstetrics & Gynecology

## 2022-04-08 ENCOUNTER — Ambulatory Visit
Admission: RE | Admit: 2022-04-08 | Discharge: 2022-04-08 | Disposition: A | Discharge status: Home / Self Care | Payer: No Typology Code available for payment source | Source: Ambulatory Visit | Attending: Obstetrics & Gynecology | Admitting: Obstetrics & Gynecology

## 2022-04-08 DIAGNOSIS — N6321 Unspecified lump in the left breast, upper outer quadrant: Secondary | ICD-10-CM

## 2022-04-08 DIAGNOSIS — R599 Enlarged lymph nodes, unspecified: Secondary | ICD-10-CM

## 2022-04-14 ENCOUNTER — Ambulatory Visit
Admission: RE | Admit: 2022-04-14 | Discharge: 2022-04-14 | Disposition: A | Discharge status: Home / Self Care | Payer: No Typology Code available for payment source | Source: Ambulatory Visit | Attending: Obstetrics & Gynecology | Admitting: Obstetrics & Gynecology

## 2022-04-14 ENCOUNTER — Other Ambulatory Visit: Payer: Self-pay | Admitting: Obstetrics & Gynecology

## 2022-04-14 DIAGNOSIS — N6321 Unspecified lump in the left breast, upper outer quadrant: Secondary | ICD-10-CM

## 2022-04-14 DIAGNOSIS — R599 Enlarged lymph nodes, unspecified: Secondary | ICD-10-CM

## 2022-04-15 ENCOUNTER — Ambulatory Visit: Payer: No Typology Code available for payment source | Admitting: Obstetrics & Gynecology

## 2022-04-16 ENCOUNTER — Encounter: Payer: Self-pay | Admitting: General Practice

## 2022-07-02 ENCOUNTER — Ambulatory Visit: Payer: No Typology Code available for payment source | Admitting: Family Medicine

## 2022-07-15 ENCOUNTER — Other Ambulatory Visit (HOSPITAL_COMMUNITY)
Admission: RE | Admit: 2022-07-15 | Discharge: 2022-07-15 | Disposition: A | Discharge status: Home / Self Care | Payer: No Typology Code available for payment source | Source: Ambulatory Visit | Attending: Family Medicine | Admitting: Family Medicine

## 2022-07-15 ENCOUNTER — Encounter: Payer: Self-pay | Admitting: Family Medicine

## 2022-07-15 ENCOUNTER — Ambulatory Visit (INDEPENDENT_AMBULATORY_CARE_PROVIDER_SITE_OTHER): Payer: No Typology Code available for payment source | Admitting: Family Medicine

## 2022-07-15 VITALS — BP 110/69 | HR 78 | Ht 61.0 in | Wt 179.0 lb

## 2022-07-15 DIAGNOSIS — Z113 Encounter for screening for infections with a predominantly sexual mode of transmission: Secondary | ICD-10-CM

## 2022-07-15 DIAGNOSIS — N898 Other specified noninflammatory disorders of vagina: Secondary | ICD-10-CM | POA: Diagnosis not present

## 2022-07-15 NOTE — Progress Notes (Signed)
   Subjective:    Patient ID: Lynda Rainwater, female    DOB: 10-25-1985, 36 y.o.   MRN: [id number]  HPI Patient seen for increased vaginal discharge over the past week.  The last time she had something similar, she had BV.  She is currently sexually active with a stable partner but would like to be checked for STIs. Discharge thin and white.  Review of Systems     Objective:   Physical Exam Vitals reviewed. Exam conducted with a chaperone present.  Constitutional:      Appearance: Normal appearance.  Genitourinary:    General: Normal vulva.     Labia:        Right: No rash, tenderness or lesion.        Left: No rash, tenderness or lesion.   Neurological:     Mental Status: She is alert.  Psychiatric:        Mood and Affect: Mood normal.        Behavior: Behavior normal.        Thought Content: Thought content normal.        Judgment: Judgment normal.       Assessment & Plan:  1. Screening for STD (sexually transmitted disease) - Hepatitis B Surface AntiGEN - Hepatitis C Antibody - RPR - HIV antibody (with reflex) - Cervicovaginal ancillary only( [place])  2. Vaginal discharge - Cervicovaginal ancillary only( [place])

## 2022-07-15 NOTE — Progress Notes (Signed)
Patient states she has noticed some vaginal discharge increase. Kathrene Alu RN

## 2022-07-16 LAB — CERVICOVAGINAL ANCILLARY ONLY
Bacterial Vaginitis (gardnerella): POSITIVE — AB
Candida Glabrata: NEGATIVE
Candida Vaginitis: POSITIVE — AB
Chlamydia: NEGATIVE
Comment: NEGATIVE
Comment: NEGATIVE
Comment: NEGATIVE
Comment: NEGATIVE
Comment: NEGATIVE
Comment: NORMAL
Neisseria Gonorrhea: NEGATIVE
Trichomonas: NEGATIVE

## 2022-07-16 LAB — RPR: RPR Ser Ql: NONREACTIVE

## 2022-07-16 LAB — HIV ANTIBODY (ROUTINE TESTING W REFLEX): HIV Screen 4th Generation wRfx: NONREACTIVE

## 2022-07-16 LAB — HEPATITIS C ANTIBODY: Hep C Virus Ab: NONREACTIVE

## 2022-07-16 LAB — HEPATITIS B SURFACE ANTIGEN: Hepatitis B Surface Ag: NEGATIVE

## 2022-07-17 ENCOUNTER — Other Ambulatory Visit (HOSPITAL_BASED_OUTPATIENT_CLINIC_OR_DEPARTMENT_OTHER): Payer: Self-pay

## 2022-07-17 MED ORDER — CLINDAMYCIN HCL 300 MG PO CAPS
300.0000 mg | ORAL_CAPSULE | Freq: Two times a day (BID) | ORAL | 0 refills | Status: DC
  Filled 2022-07-17: qty 14, 7d supply, fill #0

## 2022-07-17 MED ORDER — TERCONAZOLE 0.4 % VA CREA
1.0000 | TOPICAL_CREAM | Freq: Every day | VAGINAL | 0 refills | Status: DC
  Filled 2022-07-17: qty 45, 30d supply, fill #0

## 2022-07-17 NOTE — Addendum Note (Signed)
Addended by: Truett Mainland on: 07/17/2022 04:37 PM   Modules accepted: Orders

## 2022-07-20 ENCOUNTER — Other Ambulatory Visit: Payer: Self-pay

## 2022-07-28 ENCOUNTER — Other Ambulatory Visit (HOSPITAL_BASED_OUTPATIENT_CLINIC_OR_DEPARTMENT_OTHER): Payer: Self-pay

## 2022-09-11 ENCOUNTER — Other Ambulatory Visit: Payer: Self-pay

## 2022-09-11 DIAGNOSIS — B379 Candidiasis, unspecified: Secondary | ICD-10-CM

## 2022-09-11 MED ORDER — TERCONAZOLE 0.4 % VA CREA: TOPICAL_CREAM | VAGINAL | 0 refills | Status: DC

## 2022-09-11 NOTE — Progress Notes (Signed)
Patient called requesting medication for a yeast infection. Per Dr. Currie Paris, ok to send Terconazole 0.4% vaginal cream 1 applicator intravaginally QHS x 3 nights.  Lesley Galentine l Brittany Amirault, CMA

## 2022-09-11 NOTE — Progress Notes (Signed)
Error

## 2022-11-10 ENCOUNTER — Ambulatory Visit (INDEPENDENT_AMBULATORY_CARE_PROVIDER_SITE_OTHER): Payer: No Typology Code available for payment source | Admitting: Podiatry

## 2022-11-10 DIAGNOSIS — L600 Ingrowing nail: Secondary | ICD-10-CM | POA: Diagnosis not present

## 2022-11-10 DIAGNOSIS — B351 Tinea unguium: Secondary | ICD-10-CM | POA: Diagnosis not present

## 2022-11-10 NOTE — Progress Notes (Signed)
Subjective:   Patient ID: Nicole Kaiser, female   DOB: 37 y.o.   MRN: [id number]   HPI Chief Complaint  Patient presents with   Ingrown Toenail    Left foot hallux ingrown, lateral border, started a week ago, patient denies any drainage,    37 year old female presents the office for above concerns.  States started back a week ago maybe similar.  Currently denies any drainage or pus.   Review of Systems  All other systems reviewed and are negative.       Objective:  Physical Exam  General: AAO x3, NAD  Dermatological: Left hallux toenail is thick, discolored and is loose from the underlying nailbed distally.  The show (coloration.  Monitor patient present left lateral nail border for any change or pus.  No edema, erythema or signs of infection.  There is no effusions.  Vascular: Dorsalis Pedis artery and Posterior Tibial artery pedal pulses are 2/4 bilateral with immedate capillary fill time.  There is no pain with calf compression, swelling, warmth, erythema.   Neruologic: Grossly intact via light touch bilateral.   Musculoskeletal: Mild tenderness palpation left hallux toenail  Gait: Unassisted, Nonantalgic.       Assessment:   Left lateral hallux ingrown toenail, onychodystrophy    Plan:  -Treatment options discussed including all alternatives, risks, and complications -Etiology of symptoms were discussed -We discussed partial, total nail avulsion.  Ultimately today I did sharply debride the nail but any complications of this for culture, pathology.  Will await the results of the culture before pursuing further treatment for now.  Monitor for any signs or symptoms of infection.  Should the ingrown toenail become more tender or signs of infection will need to have it removed.  Return for nail culture results.  Trula Slade DPM

## 2022-11-10 NOTE — Patient Instructions (Addendum)
Soak Instructions    THE DAY AFTER THE PROCEDURE  Place 1/4 cup of epsom salts in a quart of warm tap water.  Submerge your foot or feet with outer bandage intact for the initial soak; this will allow the bandage to become moist and wet for easy lift off.  Once you remove your bandage, continue to soak in the solution for 20 minutes.  This soak should be done twice a day.  Next, remove your foot or feet from solution, blot dry the affected area and cover.  You may use a band aid large enough to cover the area or use gauze and tape.  Apply other medications to the area as directed by the doctor such as polysporin neosporin.  IF YOUR SKIN BECOMES IRRITATED WHILE USING THESE INSTRUCTIONS, IT IS OKAY TO SWITCH TO  WHITE VINEGAR AND WATER. Or you may use antibacterial soap and water to keep the toe clean  Monitor for any signs/symptoms of infection. Call the office immediately if any occur or go directly to the emergency room. Call with any questions/concerns.  ---  Terbinafine Tablets What is this medication? TERBINAFINE (TER bin a feen) treats fungal infections of the nails. It belongs to a group of medications called antifungals. It will not treat infections caused by bacteria or viruses. This medicine may be used for other purposes; ask your health care provider or pharmacist if you have questions. COMMON BRAND NAME(S): Lamisil, Terbinex What should I tell my care team before I take this medication? They need to know if you have any of these conditions: Liver disease An unusual or allergic reaction to terbinafine, other medications, foods, dyes, or preservatives Pregnant or trying to get pregnant Breast-feeding How should I use this medication? Take this medication by mouth with water. Take it as directed on the prescription label at the same time every day. You can take it with or without food. If it upsets your stomach, take it with food. Keep taking it unless your care team tells you to  stop. A special MedGuide will be given to you by the pharmacist with each prescription and refill. Be sure to read this information carefully each time. Talk to your care team regarding the use of this medication in children. Special care may be needed. Overdosage: If you think you have taken too much of this medicine contact a poison control center or emergency room at once. NOTE: This medicine is only for you. Do not share this medicine with others. What if I miss a dose? If you miss a dose, take it as soon as you can unless it is more than 4 hours late. If it is more than 4 hours late, skip the missed dose. Take the next dose at the normal time. What may interact with this medication? Do not take this medication with any of the following: Pimozide Thioridazine This medication may also interact with the following: Beta blockers Caffeine Certain medications for mental health conditions Cimetidine Cyclosporine Medications for fungal infections like fluconazole and ketoconazole Medications for irregular heartbeat like amiodarone, flecainide and propafenone Rifampin Warfarin This list may not describe all possible interactions. Give your health care provider a list of all the medicines, herbs, non-prescription drugs, or dietary supplements you use. Also tell them if you smoke, drink alcohol, or use illegal drugs. Some items may interact with your medicine. What should I watch for while using this medication? Visit your care team for regular checks on your progress. You may need blood work  while you are taking this medication. It may be some time before you see the benefit from this medication. This medication may cause serious skin reactions. They can happen weeks to months after starting the medication. Contact your care team right away if you notice fevers or flu-like symptoms with a rash. The rash may be red or purple and then turn into blisters or peeling of the skin. Or, you might notice a  red rash with swelling of the face, lips or lymph nodes in your neck or under your arms. This medication can make you more sensitive to the sun. Keep out of the sun, If you cannot avoid being in the sun, wear protective clothing and sunscreen. Do not use sun lamps or tanning beds/booths. What side effects may I notice from receiving this medication? Side effects that you should report to your care team as soon as possible: Allergic reactions--skin rash, itching, hives, swelling of the face, lips, tongue, or throat Change in sense of smell Change in taste Infection--fever, chills, cough, or sore throat Liver injury--right upper belly pain, loss of appetite, nausea, light-colored stool, dark yellow or brown urine, yellowing skin or eyes, unusual weakness or fatigue Low red blood cell level--unusual weakness or fatigue, dizziness, headache, trouble breathing Lupus-like syndrome--joint pain, swelling, or stiffness, butterfly-shaped rash on the face, rashes that get worse in the sun, fever, unusual weakness or fatigue Rash, fever, and swollen lymph nodes Redness, blistering, peeling, or loosening of the skin, including inside the mouth Unusual bruising or bleeding Worsening mood, feelings of depression Side effects that usually do not require medical attention (report to your care team if they continue or are bothersome): Diarrhea Gas Headache Nausea Stomach pain Upset stomach This list may not describe all possible side effects. Call your doctor for medical advice about side effects. You may report side effects to FDA at 1-800-FDA-1088. Where should I keep my medication? Keep out of the reach of children and pets. Store between 20 and 25 degrees C (68 and 77 degrees F). Protect from light. Get rid of any unused medication after the expiration date. To get rid of medications that are no longer needed or have expired: Take the medication to a medication take-back program. Check with your  pharmacy or law enforcement to find a location. If you cannot return the medication, check the label or package insert to see if the medication should be thrown out in the garbage or flushed down the toilet. If you are not sure, ask your care team. If it is safe to put it in the trash, take the medication out of the container. Mix the medication with cat litter, dirt, coffee grounds, or other unwanted substance. Seal the mixture in a bag or container. Put it in the trash. NOTE: This sheet is a summary. It may not cover all possible information. If you have questions about this medicine, talk to your doctor, pharmacist, or health care provider.  2023 Elsevier/Gold Standard (2021-03-25 00:00:00)   ---  Efinaconazole Topical Solution What is this medication? EFINACONAZOLE (e FEE na KON a zole) treats fungal infections of the nails. It belongs to a group of medications called antifungals. It will not treat infections caused by bacteria or viruses. This medicine may be used for other purposes; ask your health care provider or pharmacist if you have questions. COMMON BRAND NAME(S): JUBLIA What should I tell my care team before I take this medication? They need to know if you have any of these conditions: An  unusual or allergic reaction to efinaconazole, other medications, foods, dyes or preservatives Pregnant or trying to get pregnant Breast-feeding How should I use this medication? This medication is for external use only. Do not take by mouth. Wash your hands before and after use. Do not get it in your eyes. If you do, rinse your eyes with plenty of cool tap water. Use it as directed on the prescription label. Do not use it more often than directed. Use the medication for the full course as directed by your care team, even if you think you are better. Do not stop using it unless your care team tells you to stop it early. This medication comes with INSTRUCTIONS FOR USE. Ask your pharmacist for  directions on how to use this medication. Read the information carefully. Talk to your pharmacist or care team if you have questions. Talk to your care team about the use of this medication in children. While it may be prescribed for children as young as 6 years for selected conditions, precautions do apply. Overdosage: If you think you have taken too much of this medicine contact a poison control center or emergency room at once. NOTE: This medicine is only for you. Do not share this medicine with others. What if I miss a dose? If you miss a dose, use it as soon as you can. If it is almost time for your next dose, use only that dose. Do not use double or extra doses. What may interact with this medication? Interactions are not expected. Do not use any other skin products on the same area of skin without talking to your care team. This list may not describe all possible interactions. Give your health care provider a list of all the medicines, herbs, non-prescription drugs, or dietary supplements you use. Also tell them if you smoke, drink alcohol, or use illegal drugs. Some items may interact with your medicine. What should I watch for while using this medication? Visit your care team for regular checks on your progress. It may be some time before you see the benefit from this medication. Do not use nail polish or other nail cosmetic products on the treated nails. What side effects may I notice from receiving this medication? Side effects that you should report to your care team as soon as possible: Allergic reactions--skin rash, itching, hives, swelling of the face, lips, tongue, or throat Side effects that usually do not require medical attention (report to your care team if they continue or are bothersome): Ingrown nails Mild skin irritation, redness, or dryness This list may not describe all possible side effects. Call your doctor for medical advice about side effects. You may report side  effects to FDA at 1-800-FDA-1088. Where should I keep my medication? Keep out of the reach of children and pets. Store at room temperature between 20 and 25 degrees C (68 and 77 degrees F). Do not freeze. Keep the container tightly closed. Get rid of any unused medication after the expiration date. This medication is flammable. Avoid exposure to heat, flame, and smoking. To get rid of medications that are no longer needed or have expired: Take the medication to a medication take-back program. Check with your pharmacy or law enforcement to find a location. If you cannot return the medication, ask your pharmacist or care team how to get rid of this medication safely. NOTE: This sheet is a summary. It may not cover all possible information. If you have questions about this medicine, talk to  your doctor, pharmacist, or health care provider.  2023 Elsevier/Gold Standard (2019-09-06 00:00:00)

## 2022-11-12 ENCOUNTER — Ambulatory Visit: Payer: No Typology Code available for payment source | Admitting: Podiatry

## 2022-11-24 ENCOUNTER — Encounter: Payer: Self-pay | Admitting: General Practice

## 2022-12-23 ENCOUNTER — Ambulatory Visit (INDEPENDENT_AMBULATORY_CARE_PROVIDER_SITE_OTHER): Payer: 59 | Admitting: Family Medicine

## 2022-12-23 ENCOUNTER — Ambulatory Visit: Payer: Self-pay | Admitting: Family Medicine

## 2022-12-23 ENCOUNTER — Other Ambulatory Visit (HOSPITAL_BASED_OUTPATIENT_CLINIC_OR_DEPARTMENT_OTHER): Payer: Self-pay

## 2022-12-23 VITALS — BP 118/75 | HR 95 | Temp 98.4°F | Ht 61.0 in | Wt 187.0 lb

## 2022-12-23 DIAGNOSIS — J069 Acute upper respiratory infection, unspecified: Secondary | ICD-10-CM | POA: Diagnosis not present

## 2022-12-23 MED ORDER — HYDROCOD POLI-CHLORPHE POLI ER 10-8 MG/5ML PO SUER
5.0000 mL | Freq: Two times a day (BID) | ORAL | 0 refills | Status: AC | PRN
  Filled 2022-12-23: qty 50, 5d supply, fill #0

## 2022-12-23 MED ORDER — BENZONATATE 200 MG PO CAPS
200.0000 mg | ORAL_CAPSULE | Freq: Two times a day (BID) | ORAL | 0 refills | Status: DC | PRN
  Filled 2022-12-23: qty 20, 10d supply, fill #0

## 2022-12-23 MED ORDER — PREDNISONE 20 MG PO TABS
40.0000 mg | ORAL_TABLET | Freq: Every day | ORAL | 0 refills | Status: AC
  Filled 2022-12-23: qty 10, 5d supply, fill #0

## 2022-12-23 NOTE — Patient Instructions (Addendum)
Likely viral or allergic trigger.  Start with prednisone burst and as needed Tessalon and Tussionex for cough.  Continue supportive measures including rest, hydration, humidifier use, steam showers, warm compresses to sinuses, warm liquids with lemon and honey, and over-the-counter cough, cold, and analgesics as needed.  Work note added to [employer] If not improving after the weekend or if significantly worse before then, consider antibiotics.   Please contact office for follow-up if symptoms do not improve or worsen. Seek emergency care if symptoms become severe.       The following information is provided as a Counsellor for ADULT patients only and does NOT take into account PREGNANCY, ALLERGIES, LIVER CONDITIONS, KIDNEY CONDITIONS, GASTROINTESTINAL CONDITIONS, OR PRESCRIPTION MEDICATION INTERACTIONS. Please be sure to ask your provider if the following are safe to take with your specific medical history, conditions, or current medication regimen if you are unsure.   Adult Basic Symptom Management   Congestion: Guaifenesin (Mucinex)- follow directions on packaging with a maximum dose of 2400mg  in a 24 hour period.  Pain/Fever: Ibuprofen 200mg  - 400mg  every 4-6 hours as needed (MAX 1200mg  in a 24 hour period) Pain/Fever: Tylenol 500mg  -1000mg  every 6-8 hours as needed (MAX 3000mg  in a 24 hour period)  Cough: Dextromethorphan (Delsym)- follow directions on packing with a maximum dose of 120mg  in a 24 hour period.  Nasal Stuffiness: Saline nasal spray and/or Nettie Pot with sterile saline solution  Runny Nose: Fluticasone nasal spray (Flonase) OR Mometasone nasal spray (Nasonex) OR Triamcinolone Acetonide nasal spray (Nasacort)- follow directions on the packaging  Pain/Pressure: Warm washcloth to the face  Sore Throat: Warm salt water gargles  If you have allergies, you may also consider taking an oral antihistamine (like Zyrtec or Claritin) as these may also help with your  symptoms.  **Many medications will have more than one ingredient, be sure you are reading the packaging carefully and not taking more than one dose of the same kind of medication at the same time or too close together. It is OK to use formulas that have all of the ingredients you want, but do not take them in a combined medication and as separate dose too close together. If you have any questions, the pharmacist will be happy to help you decide what is safe.

## 2022-12-23 NOTE — Progress Notes (Deleted)
[facility] [address] [place], [place] [address] [phone]  Date:  12/23/2022   Name:  LAIKLYN CHESMORE   DOB:  06-03-1986   MRN:  [id number]  PCP:  Pearline Cables, MD    Chief Complaint: No chief complaint on file.   History of Present Illness:  KEYRY LENKER is a 37 y.o. very pleasant female patient who presents with the following:  Pt seen today with concern of congestion and laryngitis Last seen by myself about one year ago for her CPE  History of prediabetes, menorrhagia and fibroids   Patient Active Problem List   Diagnosis Date Noted   Prediabetes 01/23/2020   History of uterine fibroid 11/11/2016   History of iron deficiency anemia 11/11/2016   Menorrhagia with regular cycle 11/11/2016    Past Medical History:  Diagnosis Date   Cervical dysplasia 2013   Normal follow up paps   Chicken pox    History of UTI    Hypertension affecting pregnancy    Vaginal Pap smear, abnormal     Past Surgical History:  Procedure Laterality Date   CESAREAN SECTION  02/14/2016   colposopy      Social History   Tobacco Use   Smoking status: Never   Smokeless tobacco: Never  Vaping Use   Vaping Use: Never used  Substance Use Topics   Alcohol use: Yes    Alcohol/week: 1.0 standard drink of alcohol    Types: 1 Glasses of wine per week   Drug use: No    Family History  Problem Relation Age of Onset   Alcoholism Father    Diabetes Maternal Grandmother    Cancer Neg Hx    Hypertension Neg Hx    Stroke Neg Hx     Allergies  Allergen Reactions   Diflucan [Fluconazole]     Possible allergic reaction with eye swelling only    Metronidazole Swelling    Medication list has been reviewed and updated.  Current Outpatient Medications on File Prior to Visit  Medication Sig Dispense Refill   albuterol (VENTOLIN HFA) 108 (90 Base) MCG/ACT inhaler Inhale 2 puffs into the lungs every 6 (six) hours as  needed for wheezing or shortness of breath. (Patient not taking: Reported on 04/01/2022) 6.7 g 3   clindamycin (CLEOCIN) 300 MG capsule Take 1 capsule (300 mg total) by mouth 2 (two) times daily. For seven days 14 capsule 0   Multiple Vitamins-Minerals (MULTIVITAMIN WITH MINERALS) tablet Take 1 tablet by mouth daily.     terconazole (TERAZOL 7) 0.4 % vaginal cream 1 Applicator intravaginally QHS for 3 days. 45 g 0   No current facility-administered medications on file prior to visit.    Review of Systems:  As per HPI- otherwise negative.   Physical Examination: There were no vitals filed for this visit. There were no vitals filed for this visit. There is no height or weight on file to calculate BMI. Ideal Body Weight:    GEN: no acute distress. HEENT: Atraumatic, Normocephalic.  Ears and Nose: No external deformity. CV: RRR, No M/G/R. No JVD. No thrill. No extra heart sounds. PULM: CTA B, no wheezes, crackles, rhonchi. No retractions. No resp. distress. No accessory muscle use. ABD: S, NT, ND, +BS. No rebound. No HSM. EXTR: No c/c/e PSYCH: Normally interactive. Conversant.    Assessment and Plan: ***  Signed Abbe Amsterdam, MD

## 2022-12-23 NOTE — Progress Notes (Signed)
Acute Office Visit  Subjective:     Patient ID: Nicole Kaiser, female    DOB: 1986/03/17, 37 y.o.   MRN: [id number]  Chief Complaint  Patient presents with   Nasal Congestion    HPI   Upper Respiratory Infection: Patient complains of symptoms of a URI. Symptoms include  congestion, cough (dry), sneezing, nasal congestion, hoarse voice, chills yesterday, rhinorrhea, postnasal drainage, sinus pressure (slowly improving), sore throat (improving) . Most symptoms have started improving, but cough and laryngitis have persisted. She does have some spring allergies. Onset of symptoms was  5-6  days ago, stable since that time.  She is drinking plenty of fluids. Evaluation to date: none. Treatment to date:  NyQuil once, humidifier, hydration, rest . She has had to use her albuterol a few times due to coughing episodes triggering shortness of breath.  Son was coughing over the weekend, but thought it is was maybe allergies. No other known sick contacts.      ROS All review of systems negative except what is listed in the HPI      Objective:    BP 118/75   Pulse 95   Temp 98.4 F (36.9 C) (Oral)   Ht 5\' 1"  (1.549 m)   Wt 187 lb (84.8 kg)   SpO2 99%   BMI 35.33 kg/m    Physical Exam Vitals reviewed.  Constitutional:      Appearance: Normal appearance.  HENT:     Head: Normocephalic and atraumatic.     Comments: Voice mostly gone/hoarse    Nose: Rhinorrhea present. No congestion.     Mouth/Throat:     Mouth: Mucous membranes are moist.     Pharynx: Oropharynx is clear. No oropharyngeal exudate or posterior oropharyngeal erythema.  Cardiovascular:     Rate and Rhythm: Normal rate and regular rhythm.     Pulses: Normal pulses.     Heart sounds: Normal heart sounds.  Pulmonary:     Effort: Pulmonary effort is normal.     Breath sounds: Normal breath sounds. No wheezing, rhonchi or rales.  Musculoskeletal:     Cervical back: Normal range of motion and neck supple. No  tenderness.  Lymphadenopathy:     Cervical: No cervical adenopathy.  Skin:    General: Skin is warm and dry.  Neurological:     Mental Status: She is alert and oriented to person, place, and time.  Psychiatric:        Mood and Affect: Mood normal.        Behavior: Behavior normal.        Thought Content: Thought content normal.        Judgment: Judgment normal.         No results found for any visits on 12/23/22.      Assessment & Plan:   Problem List Items Addressed This Visit   None Visit Diagnoses     Upper respiratory tract infection, unspecified type    -  Primary   Relevant Medications   benzonatate (TESSALON) 200 MG capsule   chlorpheniramine-HYDROcodone (TUSSIONEX) 10-8 MG/5ML   predniSONE (DELTASONE) 20 MG tablet     Likely viral or allergic trigger.  Start with prednisone burst and as needed Tessalon and Tussionex for cough.  Continue supportive measures including rest, hydration, humidifier use, steam showers, warm compresses to sinuses, warm liquids with lemon and honey, and over-the-counter cough, cold, and analgesics as needed.  Work note added to [employer] If not improving after the weekend  or if significantly worse before then, consider antibiotics.       Meds ordered this encounter  Medications   benzonatate (TESSALON) 200 MG capsule    Sig: Take 1 capsule (200 mg total) by mouth 2 (two) times daily as needed for cough.    Dispense:  20 capsule    Refill:  0    Order Specific Question:   Supervising Provider    Answer:   Danise Edge A [[id number]]   chlorpheniramine-HYDROcodone (TUSSIONEX) 10-8 MG/5ML    Sig: Take 5 mLs by mouth every 12 (twelve) hours as needed for up to 5 days.    Dispense:  50 mL    Refill:  0    Order Specific Question:   Supervising Provider    Answer:   Danise Edge A [[id number]]   predniSONE (DELTASONE) 20 MG tablet    Sig: Take 2 tablets (40 mg total) by mouth daily with breakfast for 5 days.    Dispense:  10 tablet     Refill:  0    Order Specific Question:   Supervising Provider    Answer:   Danise Edge A [[id number]]    Return if symptoms worsen or fail to improve.  Clayborne Dana, NP

## 2022-12-29 NOTE — Progress Notes (Deleted)
[facility] [address] [place], [place] [address] [phone]  Date:  12/31/2022   Name:  AIRYN ELLZEY   DOB:  1986/02/29   MRN:  [id number]  PCP:  Pearline Cables, MD    Chief Complaint: No chief complaint on file.   History of Present Illness:  GENISIS SONNIER is a 37 y.o. very pleasant female patient who presents with the following:  Patient seen today to discuss leave of absence/FMLA from her job Most recent visit with myself May 2023 for her physical History of prediabetes, menorrhagia, fibroid tumors  Patient Active Problem List   Diagnosis Date Noted   Prediabetes 01/23/2020   History of uterine fibroid 11/11/2016   History of iron deficiency anemia 11/11/2016   Menorrhagia with regular cycle 11/11/2016    Past Medical History:  Diagnosis Date   Cervical dysplasia 2013   Normal follow up paps   Chicken pox    History of UTI    Hypertension affecting pregnancy    Vaginal Pap smear, abnormal     Past Surgical History:  Procedure Laterality Date   CESAREAN SECTION  02/14/2016   colposopy      Social History   Tobacco Use   Smoking status: Never   Smokeless tobacco: Never  Vaping Use   Vaping Use: Never used  Substance Use Topics   Alcohol use: Yes    Alcohol/week: 1.0 standard drink of alcohol    Types: 1 Glasses of wine per week   Drug use: No    Family History  Problem Relation Age of Onset   Alcoholism Father    Diabetes Maternal Grandmother    Cancer Neg Hx    Hypertension Neg Hx    Stroke Neg Hx     Allergies  Allergen Reactions   Diflucan [Fluconazole]     Possible allergic reaction with eye swelling only    Metronidazole Swelling    Medication list has been reviewed and updated.  Current Outpatient Medications on File Prior to Visit  Medication Sig Dispense Refill   albuterol (VENTOLIN HFA) 108 (90 Base) MCG/ACT inhaler Inhale 2 puffs into the lungs every 6  (six) hours as needed for wheezing or shortness of breath. 6.7 g 3   benzonatate (TESSALON) 200 MG capsule Take 1 capsule (200 mg total) by mouth 2 (two) times daily as needed for cough. 20 capsule 0   Multiple Vitamins-Minerals (MULTIVITAMIN WITH MINERALS) tablet Take 1 tablet by mouth daily.     No current facility-administered medications on file prior to visit.    Review of Systems:  As per HPI- otherwise negative.   Physical Examination: There were no vitals filed for this visit. There were no vitals filed for this visit. There is no height or weight on file to calculate BMI. Ideal Body Weight:    GEN: no acute distress. HEENT: Atraumatic, Normocephalic.  Ears and Nose: No external deformity. CV: RRR, No M/G/R. No JVD. No thrill. No extra heart sounds. PULM: CTA B, no wheezes, crackles, rhonchi. No retractions. No resp. distress. No accessory muscle use. ABD: S, NT, ND, +BS. No rebound. No HSM. EXTR: No c/c/e PSYCH: Normally interactive. Conversant.    Assessment and Plan: ***  Signed Abbe Amsterdam, MD

## 2022-12-31 ENCOUNTER — Other Ambulatory Visit (HOSPITAL_BASED_OUTPATIENT_CLINIC_OR_DEPARTMENT_OTHER): Payer: Self-pay

## 2022-12-31 ENCOUNTER — Ambulatory Visit (INDEPENDENT_AMBULATORY_CARE_PROVIDER_SITE_OTHER): Payer: 59 | Admitting: Family Medicine

## 2022-12-31 ENCOUNTER — Ambulatory Visit: Payer: 59 | Admitting: Family Medicine

## 2022-12-31 ENCOUNTER — Encounter: Payer: Self-pay | Admitting: Family Medicine

## 2022-12-31 VITALS — BP 122/74 | HR 72 | Temp 98.1°F | Resp 18 | Ht 61.0 in | Wt 183.2 lb

## 2022-12-31 DIAGNOSIS — F4323 Adjustment disorder with mixed anxiety and depressed mood: Secondary | ICD-10-CM

## 2022-12-31 DIAGNOSIS — R062 Wheezing: Secondary | ICD-10-CM | POA: Diagnosis not present

## 2022-12-31 MED ORDER — ALBUTEROL SULFATE HFA 108 (90 BASE) MCG/ACT IN AERS
2.0000 | INHALATION_SPRAY | Freq: Four times a day (QID) | RESPIRATORY_TRACT | 3 refills | Status: DC | PRN
  Filled 2022-12-31: qty 6.7, 25d supply, fill #0

## 2022-12-31 NOTE — Progress Notes (Signed)
[facility] [address], [address] [place], [place] [address] [phone]  Date:  12/31/2022   Name:  Nicole Kaiser   DOB:  [birth year]   MRN:  [id number]  PCP:  Pearline Cables, MD    Chief Complaint: FMLA/ Grief   History of Present Illness:  JALESSA Kaiser is a 37 y.o. very pleasant female patient who presents with the following:  Patient seen today for concern of acute grief, possible need for FMLA Most recent visit with myself was about 1 year ago for physical exam History of prediabetes, menorrhagia, fibroids  She has GYN care at the [facility]  She notes her mother died in early 2023-09-06 of last year, and her mother's twin (uncle) died last year as well  She was seen by Ladona Ridgel last week for URI She has used prednisone and she is getting better- she is about 85% better now from URI sx   She is having a hard time at work right now- s She has a nearly 35 yo son- he is in 1st grade She would like to take a leave from her job - she was out for 2 weeks for the funeral but otherwise she has not taken bereavement leave 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  She notes a lot of worry, "my mind is all over the place," she is getting more easily distracted and has made some mistakes she would not typically make  Her job involves irregular and long hours which adds to her stress level She does note sx of depression as well as anxiety  She may feel fatigued and have some withdrawal No SI She does have not much in the way of support here- most of her family is in [place] She did some counseling last year- she has some resource from her job to connect with a counselor again which she plans to do   She would like to be on leave for about 2 weeks- will start tomorrow 01/01/23 and RTW 01/18/23  Patient Active Problem List   Diagnosis Date Noted   Prediabetes 01/23/2020   History of uterine  fibroid 11/11/2016   History of iron deficiency anemia 11/11/2016   Menorrhagia with regular cycle 11/11/2016    Past Medical History:  Diagnosis Date   Cervical dysplasia 2013   Normal follow up paps   Chicken pox    History of UTI    Hypertension affecting pregnancy    Vaginal Pap smear, abnormal     Past Surgical History:  Procedure Laterality Date   CESAREAN SECTION  02/14/2016   colposopy      Social History   Tobacco Use   Smoking status: Never   Smokeless tobacco: Never  Vaping Use   Vaping Use: Never used  Substance Use Topics   Alcohol use: Yes    Alcohol/week: 1.0 standard drink of alcohol    Types: 1 Glasses of wine per week   Drug use: No    Family History  Problem Relation Age of Onset   Alcoholism Father    Diabetes Maternal Grandmother    Cancer Neg Hx    Hypertension Neg Hx    Stroke Neg Hx     Allergies  Allergen Reactions   Diflucan [Fluconazole]     Possible allergic reaction with eye swelling only    Metronidazole Swelling    Medication list  has been reviewed and updated.  Current Outpatient Medications on File Prior to Visit  Medication Sig Dispense Refill   benzonatate (TESSALON) 200 MG capsule Take 1 capsule (200 mg total) by mouth 2 (two) times daily as needed for cough. 20 capsule 0   Multiple Vitamins-Minerals (MULTIVITAMIN WITH MINERALS) tablet Take 1 tablet by mouth daily.     No current facility-administered medications on file prior to visit.    Review of Systems:  As per HPI- otherwise negative.   Physical Examination: Vitals:   12/31/22 1117  BP: 122/74  Pulse: 72  Resp: 18  Temp: 98.1 F (36.7 C)  SpO2: 99%   Vitals:   12/31/22 1117  Weight: 183 lb 3.2 oz (83.1 kg)  Height:  (1.549 m)   Body mass index is 34.62 kg/m. Ideal Body Weight: Weight in (lb) to have BMI = 25: 132  GEN: no acute distress.  Mild obesity, looks well  HEENT: Atraumatic, Normocephalic.  Ears and Nose: No external  deformity. CV: RRR, No M/G/R. No JVD. No thrill. No extra heart sounds. PULM: CTA B, no wheezes, crackles, rhonchi. No retractions. No resp. distress. No accessory muscle use. EXTR: No c/c/e PSYCH: Normally interactive. Conversant.    Assessment and Plan: Adjustment disorder with mixed anxiety and depressed mood  Wheezing - Plan: albuterol (VENTOLIN HFA) 108 (90 Base) MCG/ACT inhaler  Patient seen today with concern of anxiety and depression symptoms, likely related to her mother's death this past Sep 15, 2023.  She would like to take 2 weeks off of work so that she can concentrate on processing this loss.  She notes her symptoms are contributing to difficulty concentrating and completing her tasks at work.  We discussed potentially starting medication for anxiety and depression, she declines for the moment but will let me know if she changes her mind.  She also plans to establish with a counselor  Asked her to let me know if she is not improving, sooner if getting worse  Refilled her albuterol Signed Abbe Amsterdam, MD

## 2022-12-31 NOTE — Patient Instructions (Signed)
It was good to see you today, I am so sorry for the recent loss of your mother! Please let me know if you need anything further from [place] regarding your leave of absence from work I do think starting counseling is a good idea.  We can also certainly consider starting medication if you would like. If your symptoms are not getting better please be sure to let me know.

## 2023-01-12 ENCOUNTER — Encounter: Payer: Self-pay | Admitting: Family Medicine

## 2023-01-18 NOTE — Telephone Encounter (Signed)
Patient would like to get an update of her FMLA paperwork. Please advise.

## 2023-02-26 IMAGING — US US THYROID
1 series · 13 of 25 positions shown · non-contrast
Comparison: None Available.

CLINICAL DATA: Enlarged thyroid

EXAM:
THYROID ULTRASOUND
TECHNIQUE: Ultrasound examination of the thyroid gland and adjacent soft
tissues was performed.

[Series 1: us thyroid · 13 of 73 slices shown]
[im 1/73]
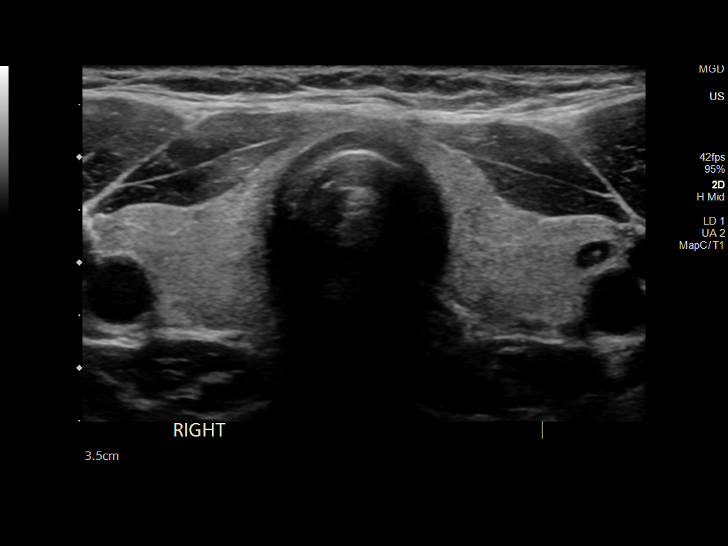
[im 7/73]
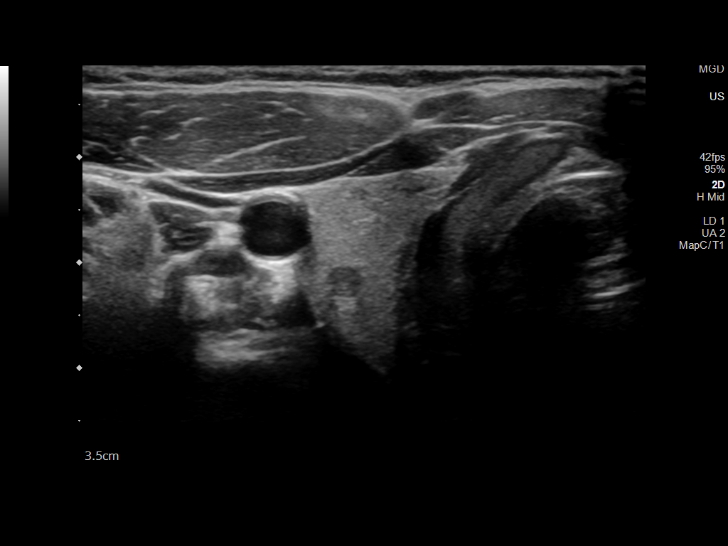
[im 13/73]
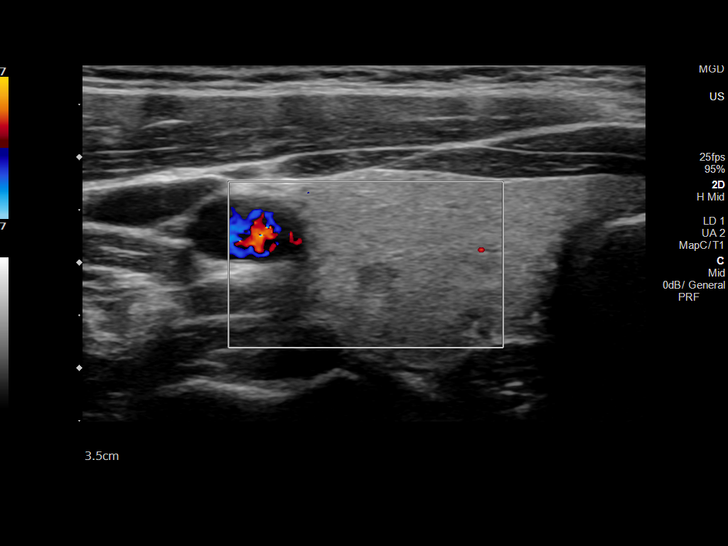
[im 19/73]
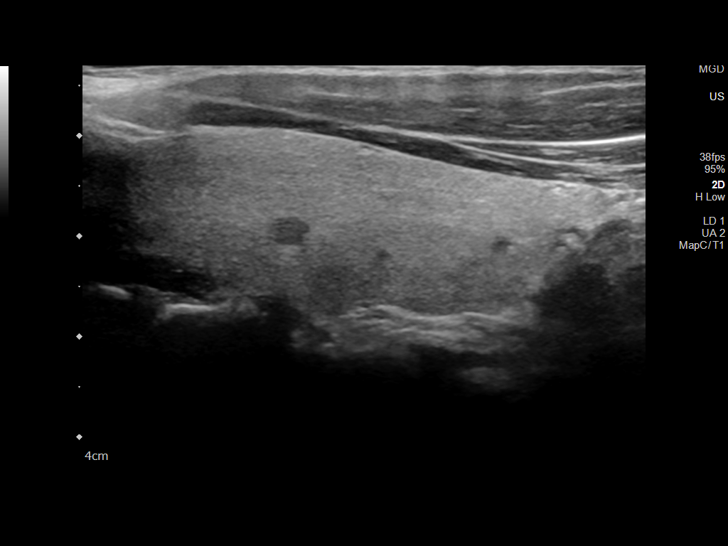
[im 25/73]
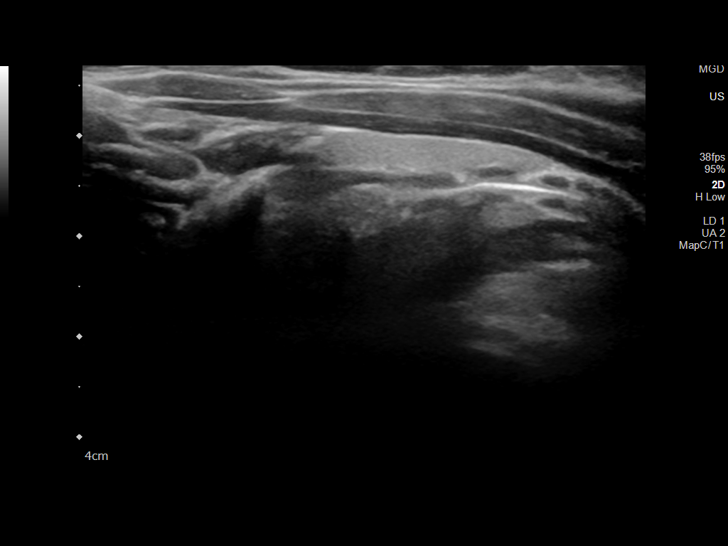
[im 31/73]
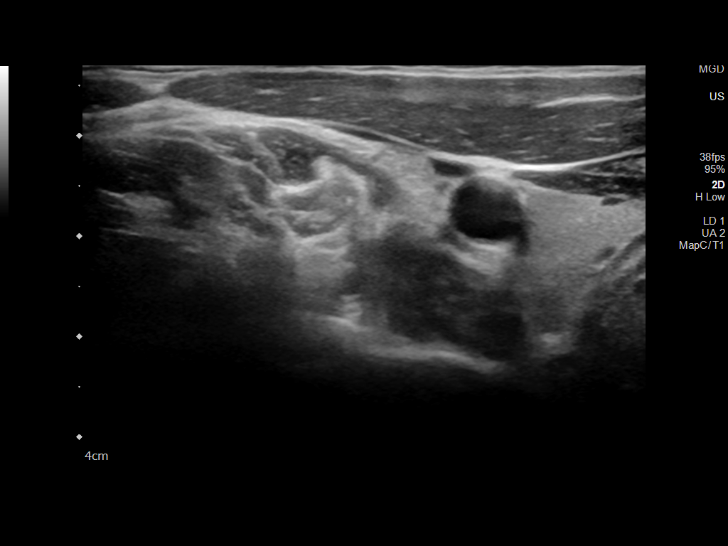
[im 37/73]
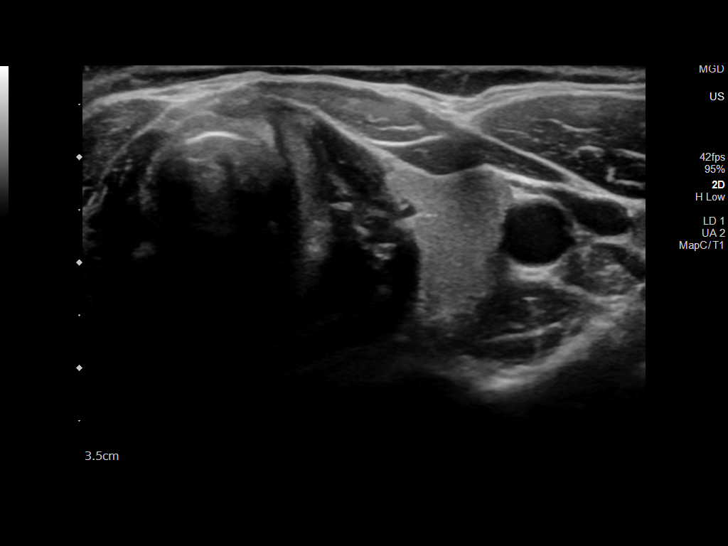
[im 43/73]
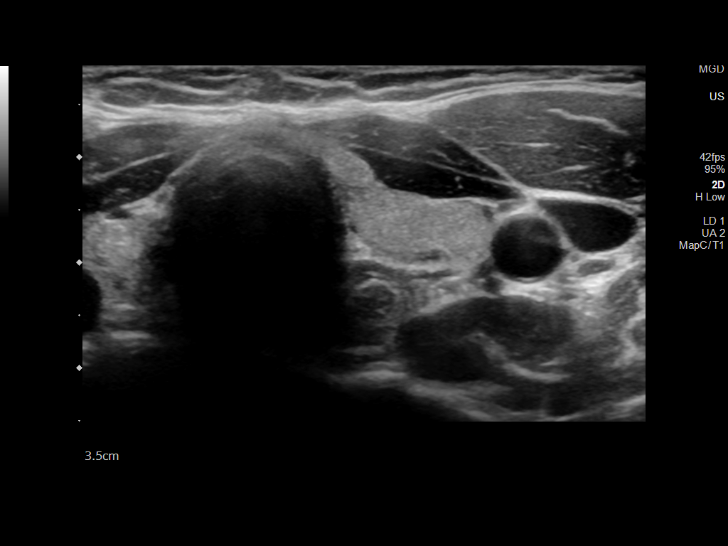
[im 49/73]
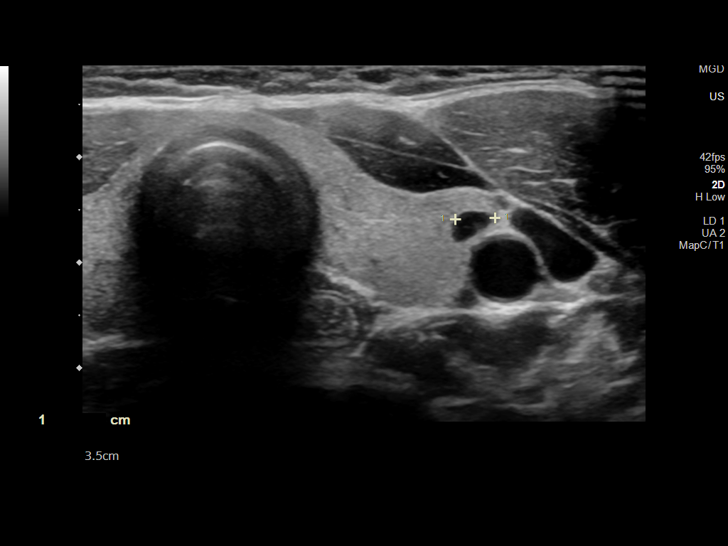
[im 55/73]
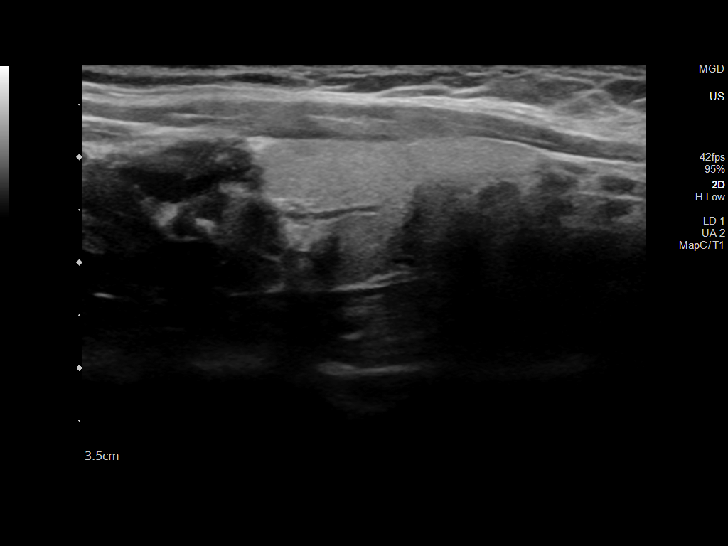
[im 61/73]
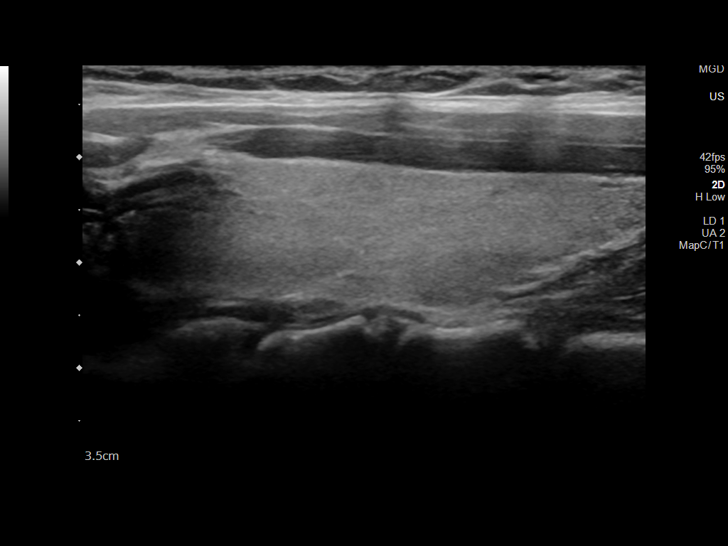
[im 67/73]
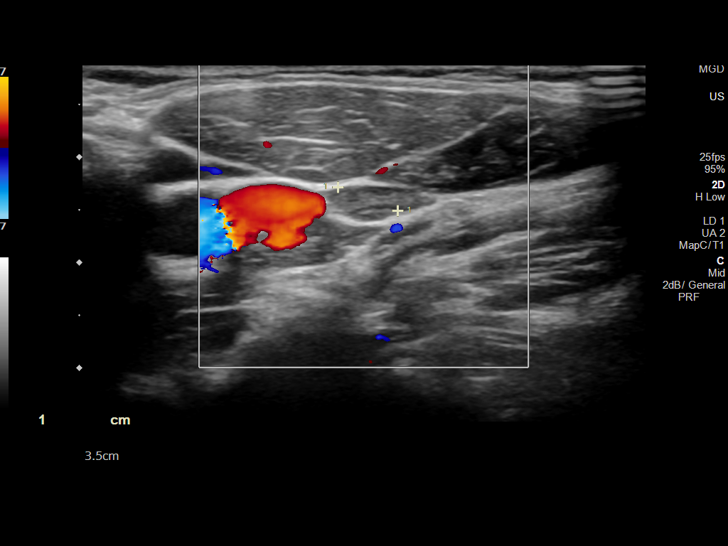
[im 73/73]
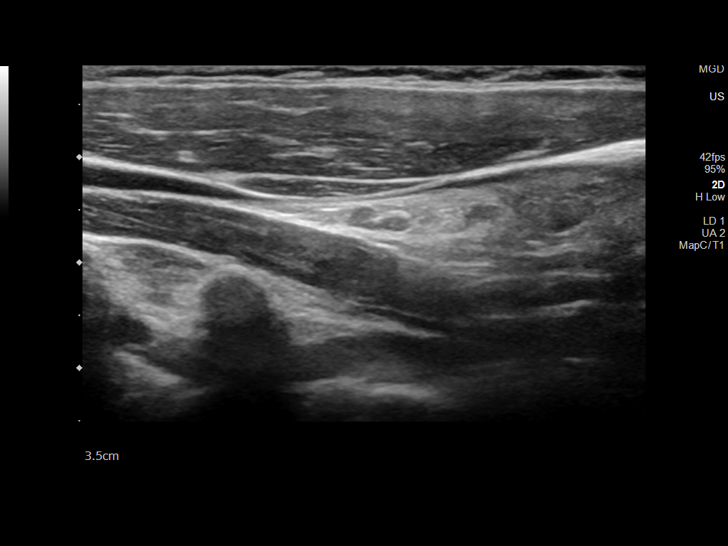

[13 of 25 positions shown; findings below may reference images not displayed]

FINDINGS: Parenchymal Echotexture: Normal

Isthmus: 0.2 cm

Right lobe: 4.8 x 1.7 x 1.6 cm

Left lobe: 5.1 x 1.2 x 1.7 cm

_________________________________________________________

Estimated total number of nodules >/= 1 cm: 0

Number of spongiform nodules >/=  2 cm not described below (TR1): 0

Number of mixed cystic and solid nodules >/= 1.5 cm not described
below (TR2): 0

_________________________________________________________

Small subcentimeter solid hypoechoic TR 4 nodule in the right
thyroid lobe measures less than 5 mm. Given size (<0.9 cm) and
appearance, this nodule does NOT meet TI-RADS criteria for biopsy or
dedicated follow-up.

Small subcentimeter predominantly anechoic cystic TR 1 nodule in the
left thyroid lobe measures 6 mm. This nodule does NOT meet TI-RADS
criteria for biopsy or dedicated follow-up.
IMPRESSION: Normal size of the thyroid gland. A couple of small and/or
benign-appearing thyroid nodules require no further dedicated
follow-up or biopsy.

The above is in keeping with the ACR TI-RADS recommendations - [HOSPITAL] 6252;[DATE].

## 2023-05-03 ENCOUNTER — Ambulatory Visit: Payer: 59 | Admitting: Podiatry

## 2023-05-25 ENCOUNTER — Ambulatory Visit: Payer: 59 | Admitting: Podiatry

## 2023-05-25 ENCOUNTER — Ambulatory Visit: Payer: 59

## 2023-05-25 DIAGNOSIS — L600 Ingrowing nail: Secondary | ICD-10-CM

## 2023-05-25 DIAGNOSIS — M779 Enthesopathy, unspecified: Secondary | ICD-10-CM

## 2023-05-25 NOTE — Patient Instructions (Signed)

## 2023-05-25 NOTE — Progress Notes (Signed)
Subjective: 37 year old female presents the office today for concerns of ingrown toenail to left, lateral nail border.  General the nails thickened discolored but unchanged started getting painful.  Does not report any drainage or pus.  No injuries.  No new concerns otherwise.  Objective: AAO x3, NAD DP/PT pulses palpable bilaterally, CRT less than 3 seconds Left hallux nails hypertrophic, dystrophic and there is incurvation present to both medial lateral nail borders with tenderness palpation on the lateral aspect.  No pain on medial aspect.  There is no edema, erythema to the medial aspect with localized edema and erythema to lateral aspect. No pain with calf compression, swelling, warmth, erythema  Assessment: Ingrown toenail left lateral nail border, onychodystrophy  Plan: -All treatment options discussed with the patient including all alternatives, risks, complications.  -We discussed partial versus total nail avulsion.  After long discussion decided to proceed with partial nail avulsion with chemical patient with left lateral nail border. -X-rays were obtained reviewed.  No significant spurring present off the distal phalanx that is likely causing her symptoms.  -At this time, the patient is requesting partial nail removal with chemical matricectomy to the symptomatic portion of the nail. Risks and complications were discussed with the patient for which they understand and written consent was obtained. Under sterile conditions a total of 3 mL of a mixture of 2% lidocaine plain and 0.5% Marcaine plain was infiltrated in a hallux block fashion. Once anesthetized, the skin was prepped in sterile fashion. A tourniquet was then applied. Next the lateral aspect of hallux nail border was then sharply excised making sure to remove the entire offending nail border. Once the nails were ensured to be removed area was debrided and the underlying skin was intact. There is no purulence identified in the  procedure. Next sodium hydroxide was then applied under standard conditions and copiously irrigated. Antibiotic ointment was applied. A dry sterile dressing was applied. After application of the dressing the tourniquet was removed and there is found to be an immediate capillary refill time to the digit. The patient tolerated the procedure well any complications. Post procedure instructions were discussed the patient for which he verbally understood. Discussed signs/symptoms of infection and directed to call the office immediately should any occur or go directly to the emergency room. In the meantime, encouraged to call the office with any questions, concerns, changes symptoms. -Patient encouraged to call the office with any questions, concerns, change in symptoms.   Vivi Barrack DPM

## 2023-05-26 ENCOUNTER — Other Ambulatory Visit (HOSPITAL_BASED_OUTPATIENT_CLINIC_OR_DEPARTMENT_OTHER): Payer: Self-pay

## 2023-05-26 ENCOUNTER — Other Ambulatory Visit: Payer: Self-pay | Admitting: Podiatry

## 2023-05-26 ENCOUNTER — Encounter: Payer: Self-pay | Admitting: Podiatry

## 2023-05-26 MED ORDER — TRAMADOL HCL 50 MG PO TABS
50.0000 mg | ORAL_TABLET | Freq: Three times a day (TID) | ORAL | 0 refills | Status: AC | PRN
  Filled 2023-05-26: qty 10, 4d supply, fill #0

## 2023-05-27 ENCOUNTER — Telehealth: Payer: Self-pay

## 2023-05-27 NOTE — Telephone Encounter (Signed)
Patient called stating that she is in extreme pain from partial ingrown nail removal on Tuesday. She says that she needs some clarification on the message that was sent to her regarding removal of bandage. She doesn't quite understand what "remove bandage if needed". She says that she is in extreme pain and can barely walk. She has been soaking foot with bandage on and doesn't know if she is doing more harm than good.When she elevates her foot the pain if fine,but once she lowers her foot she has extreme pain.Should she remove bandage now.

## 2023-06-08 ENCOUNTER — Telehealth: Payer: Self-pay | Admitting: Physician Assistant

## 2023-06-08 DIAGNOSIS — R3989 Other symptoms and signs involving the genitourinary system: Secondary | ICD-10-CM

## 2023-06-08 MED ORDER — CEPHALEXIN 500 MG PO CAPS: 500.0000 mg | ORAL_CAPSULE | Freq: Two times a day (BID) | ORAL | 0 refills | Status: AC

## 2023-06-08 NOTE — Progress Notes (Signed)
Virtual Visit Consent   [name], you are scheduled for a virtual visit with a [facility] provider today. Just as with appointments in the office, your consent must be obtained to participate. Your consent will be active for this visit and any virtual visit you may have with one of our providers in the next 365 days. If you have a MyChart account, a copy of this consent can be sent to you electronically.  As this is a virtual visit, video technology does not allow for your provider to perform a traditional examination. This may limit your provider's ability to fully assess your condition. If your provider identifies any concerns that need to be evaluated in person or the need to arrange testing (such as labs, EKG, etc.), we will make arrangements to do so. Although advances in technology are sophisticated, we cannot ensure that it will always work on either your end or our end. If the connection with a video visit is poor, the visit may have to be switched to a telephone visit. With either a video or telephone visit, we are not always able to ensure that we have a secure connection.  By engaging in this virtual visit, you consent to the provision of healthcare and authorize for your insurance to be billed (if applicable) for the services provided during this visit. Depending on your insurance coverage, you may receive a charge related to this service.  I need to obtain your verbal consent now. Are you willing to proceed with your visit today? Nicole Kaiser has provided verbal consent on 06/08/2023 for a virtual visit (video or telephone). [name], [place]  Date: 06/08/2023 8:35 AM  Virtual Visit via Video Note   I, [name], connected with  Nicole Kaiser  ([id number], [birth year]) on 06/08/23 at  8:30 AM EDT by a video-enabled telemedicine application and verified that I am speaking with the correct person using two identifiers.  Location: Patient: Virtual Visit Location  Patient: Mobile Provider: Virtual Visit Location Provider: Home Office   I discussed the limitations of evaluation and management by telemedicine and the availability of in person appointments. The patient expressed understanding and agreed to proceed.    History of Present Illness: Nicole Kaiser is a 37 y.o. who identifies as a female who was assigned female at birth, and is being seen today for possible UTI. Notes overnight with suprapubic pressure and dysuria with scant blood when wiping. Has history of UTI. Denies fevers, chills, nausea or vomiting. Denies back pain. Denies vaginal symptoms. LMP ended 1.5 weeks ago. Denies concerns for pregnancy.  HPI: HPI  Problems:  Patient Active Problem List   Diagnosis Date Noted   Prediabetes 01/23/2020   History of uterine fibroid 11/11/2016   History of iron deficiency anemia 11/11/2016   Menorrhagia with regular cycle 11/11/2016    Allergies:  Allergies  Allergen Reactions   Diflucan [Fluconazole]     Possible allergic reaction with eye swelling only    Metronidazole Swelling   Medications:  Current Outpatient Medications:    cephALEXin (KEFLEX) 500 MG capsule, Take 1 capsule (500 mg total) by mouth 2 (two) times daily for 7 days., Disp: 14 capsule, Rfl: 0   albuterol (VENTOLIN HFA) 108 (90 Base) MCG/ACT inhaler, Inhale 2 puffs into the lungs every 6 (six) hours as needed for wheezing or shortness of breath., Disp: 6.7 g, Rfl: 3   Multiple Vitamins-Minerals (MULTIVITAMIN WITH MINERALS) tablet, Take 1 tablet by mouth daily., Disp: ,  Rfl:   Observations/Objective: Patient is well-developed, well-nourished in no acute distress.  Resting comfortably in parked car.  Head is normocephalic, atraumatic.  No labored breathing.  Speech is clear and coherent with logical content.  Patient is alert and oriented at baseline.   Assessment and Plan: 1. Suspected UTI - cephALEXin (KEFLEX) 500 MG capsule; Take 1 capsule (500 mg total) by mouth 2  (two) times daily for 7 days.  Dispense: 14 capsule; Refill: 0  Classic UTI symptoms with absence of alarm signs or symptoms. Prior history of UTI. Will treat empirically with Keflex for suspected uncomplicated cystitis. Supportive measures and OTC medications reviewed. Strict in-person evaluation precautions discussed.    Follow Up Instructions: I discussed the assessment and treatment plan with the patient. The patient was provided an opportunity to ask questions and all were answered. The patient agreed with the plan and demonstrated an understanding of the instructions.  A copy of instructions were sent to the patient via MyChart unless otherwise noted below.   The patient was advised to call back or seek an in-person evaluation if the symptoms worsen or if the condition fails to improve as anticipated.  Time:  I spent 10 minutes with the patient via telehealth technology discussing the above problems/concerns.    [name], PA-C

## 2023-06-08 NOTE — Patient Instructions (Signed)
[name], thank you for joining [name], PA-C for today's virtual visit.  While this provider is not your primary care provider (PCP), if your PCP is located in our provider database this encounter information will be shared with them immediately following your visit.   A [facility] MyChart account gives you access to today's visit and all your visits, tests, and labs performed at [facility] " click here if you don't have a [facility] MyChart account or go to [email or web address]  Consent: (Patient) Nicole Kaiser provided verbal consent for this virtual visit at the beginning of the encounter.  Current Medications:  Current Outpatient Medications:    albuterol (VENTOLIN HFA) 108 (90 Base) MCG/ACT inhaler, Inhale 2 puffs into the lungs every 6 (six) hours as needed for wheezing or shortness of breath., Disp: 6.7 g, Rfl: 3   Multiple Vitamins-Minerals (MULTIVITAMIN WITH MINERALS) tablet, Take 1 tablet by mouth daily., Disp: , Rfl:    Medications ordered in this encounter:  No orders of the defined types were placed in this encounter.    *If you need refills on other medications prior to your next appointment, please contact your pharmacy*  Follow-Up: Call back or seek an in-person evaluation if the symptoms worsen or if the condition fails to improve as anticipated.  [facility] [phone]  Other Instructions Your symptoms are consistent with a bladder infection, also called acute cystitis. Please take your antibiotic (Keflex) as directed until all pills are gone.  Stay very well hydrated.  Consider a daily probiotic (Align, Culturelle, or Activia) to help prevent stomach upset caused by the antibiotic.  Taking a probiotic daily may also help prevent recurrent UTIs.  Also consider taking AZO (Phenazopyridine) tablets to help decrease pain with urination.    Urinary Tract Infection A urinary tract infection (UTI) can occur any place  along the urinary tract. The tract includes the kidneys, ureters, bladder, and urethra. A type of germ called bacteria often causes a UTI. UTIs are often helped with antibiotic medicine.  HOME CARE  If given, take antibiotics as told by your doctor. Finish them even if you start to feel better. Drink enough fluids to keep your pee (urine) clear or pale yellow. Avoid tea, drinks with caffeine, and bubbly (carbonated) drinks. Pee often. Avoid holding your pee in for a long time. Pee before and after having sex (intercourse). Wipe from front to back after you poop (bowel movement) if you are a woman. Use each tissue only once. GET HELP RIGHT AWAY IF:  You have back pain. You have lower belly (abdominal) pain. You have chills. You feel sick to your stomach (nauseous). You throw up (vomit). Your burning or discomfort with peeing does not go away. You have a fever. Your symptoms are not better in 3 days. MAKE SURE YOU:  Understand these instructions. Will watch your condition. Will get help right away if you are not doing well or get worse. Document Released: 02/17/2008 Document Revised: 05/25/2012 Document Reviewed: 03/31/2012 [facility] Patient Information 2015 [place], [place]. This information is not intended to replace advice given to you by your health care provider. Make sure you discuss any questions you have with your health care provider.    If you have been instructed to have an in-person evaluation today at a local Urgent Care facility, please use the link below. It will take you to a list of all of our available [facility], including address, phone number and  hours of operation. Please do not delay care.  [facility]  If you or a family member do not have a primary care provider, use the link below to schedule a visit and establish care. When you choose a [facility] primary care physician or advanced practice provider, you gain a long-term partner in  health. Find a Primary Care Provider  Learn more about Nicole Kaiser's in-office and virtual care options: [facility] - Get Care Now

## 2023-06-11 ENCOUNTER — Ambulatory Visit: Payer: 59 | Admitting: Podiatry

## 2023-06-11 DIAGNOSIS — L603 Nail dystrophy: Secondary | ICD-10-CM | POA: Diagnosis not present

## 2023-06-11 DIAGNOSIS — L600 Ingrowing nail: Secondary | ICD-10-CM

## 2023-06-11 NOTE — Progress Notes (Signed)
Subjective: No chief complaint on file.   36 year old female presents the office today for follow-up evaluation of pending partial nail avulsion of the left hallux nail border.  States that she is doing well.  She is still soaking in warm soapy water.  Her pain is much improved.  No drainage or pus.  In general she states that she has tightness within the show her left foot is been ongoing for quite some time and this is not new symptom of the nail corner removed.  Objective: AAO x3, NAD DP/PT pulses palpable bilaterally, CRT less than 3 seconds Left hallux nails hypertrophic, dystrophic with yellow, brown discoloration.  There is no edema, erythema.  Procedure site appears to be healing well.  Scab recently just came off.  There is no surrounding erythema, ascending cellulitis.  No drainage or pus. No open lesions.  No other areas of discomfort.  No pain with calf compression, swelling, warmth, erythema  Assessment: Ingrown toenail left lateral nail border, onychodystrophy  Plan: -All treatment options discussed with the patient including all alternatives, risks, complications.  -Procedure sites healing well.  Continue soaking and antibiotic ointment administered today but leave the area open at nighttime.  Monitor any signs or symptoms of infection or reoccurrence. -Reviewed the nail culture result.  Although it did not show fungus could be a false negative and I ordered a compound cream to [place]. -We discussed buying shoes with a bigger foot we discussed proper shoe fitting.  I think could also contribute to the microtrauma to the toenail.  No follow-ups on file.  Vivi Barrack DPM

## 2023-08-18 ENCOUNTER — Ambulatory Visit: Payer: 59

## 2023-08-18 ENCOUNTER — Other Ambulatory Visit (HOSPITAL_COMMUNITY)
Admission: RE | Admit: 2023-08-18 | Discharge: 2023-08-18 | Disposition: A | Discharge status: Home / Self Care | Payer: 59 | Source: Ambulatory Visit | Attending: Family Medicine | Admitting: Family Medicine

## 2023-08-18 VITALS — BP 127/76 | HR 69 | Ht 61.0 in

## 2023-08-18 DIAGNOSIS — Z113 Encounter for screening for infections with a predominantly sexual mode of transmission: Secondary | ICD-10-CM | POA: Diagnosis present

## 2023-08-18 DIAGNOSIS — N760 Acute vaginitis: Secondary | ICD-10-CM | POA: Diagnosis not present

## 2023-08-18 DIAGNOSIS — R300 Dysuria: Secondary | ICD-10-CM | POA: Diagnosis not present

## 2023-08-18 DIAGNOSIS — B9689 Other specified bacterial agents as the cause of diseases classified elsewhere: Secondary | ICD-10-CM | POA: Diagnosis not present

## 2023-08-18 LAB — POCT URINALYSIS DIPSTICK
Bilirubin, UA: NEGATIVE
Glucose, UA: NEGATIVE
Ketones, UA: NEGATIVE
Nitrite, UA: NEGATIVE
Protein, UA: NEGATIVE
Spec Grav, UA: 1.015 (ref 1.010–1.025)
Urobilinogen, UA: 0.2 U/dL
pH, UA: 6.5 (ref 5.0–8.0)

## 2023-08-18 NOTE — Progress Notes (Signed)
SUBJECTIVE:  37 y.o. desires self swab. Denies abnormal vaginal bleeding or significant pelvic pain or fever.  No LMP recorded (lmp unknown).  OBJECTIVE:  She appears well, afebrile.   PLAN:  GC, chlamydia, trichomonas, BVAG, CVAG probe sent to lab. Treatment: To be determined once lab results are received ROV prn if symptoms persist or worsen.

## 2023-08-18 NOTE — Progress Notes (Signed)
SUBJECTIVE: Nicole Kaiser is a 37 y.o. female who complains of dysuria x 1 day, without flank pain, fever, chills, or abnormal vaginal discharge or bleeding.   OBJECTIVE: Appears well, in no apparent distress.  Vital signs are normal. Urine dipstick shows positive for RBC's and positive for leukocytes.    ASSESSMENT: Dysuria  PLAN: Treatment to be determined once urine culture results. Call or return to clinic prn if these symptoms worsen or fail to improve as anticipated.

## 2023-08-19 NOTE — Progress Notes (Signed)
Chart reviewed - agree with CMA/RN documentation.  ° °

## 2023-08-20 ENCOUNTER — Other Ambulatory Visit (HOSPITAL_BASED_OUTPATIENT_CLINIC_OR_DEPARTMENT_OTHER): Payer: Self-pay

## 2023-08-20 ENCOUNTER — Telehealth: Payer: Self-pay

## 2023-08-20 ENCOUNTER — Encounter: Payer: Self-pay | Admitting: Family Medicine

## 2023-08-20 LAB — CERVICOVAGINAL ANCILLARY ONLY
Bacterial Vaginitis (gardnerella): POSITIVE — AB
Candida Glabrata: NEGATIVE
Candida Vaginitis: NEGATIVE
Chlamydia: NEGATIVE
Comment: NEGATIVE
Comment: NEGATIVE
Comment: NEGATIVE
Comment: NEGATIVE
Comment: NEGATIVE
Comment: NORMAL
Neisseria Gonorrhea: NEGATIVE
Trichomonas: NEGATIVE

## 2023-08-20 LAB — URINE CULTURE

## 2023-08-20 MED ORDER — SULFAMETHOXAZOLE-TRIMETHOPRIM 800-160 MG PO TABS
1.0000 | ORAL_TABLET | Freq: Two times a day (BID) | ORAL | 0 refills | Status: AC
  Filled 2023-08-20: qty 10, 5d supply, fill #0

## 2023-08-20 MED ORDER — METRONIDAZOLE 500 MG PO TABS
500.0000 mg | ORAL_TABLET | Freq: Two times a day (BID) | ORAL | 0 refills | Status: DC
  Filled 2023-08-20: qty 14, 7d supply, fill #0

## 2023-08-20 MED ORDER — PHENAZOPYRIDINE HCL 100 MG PO TABS
100.0000 mg | ORAL_TABLET | Freq: Three times a day (TID) | ORAL | 0 refills | Status: DC | PRN
  Filled 2023-08-20: qty 10, 4d supply, fill #0

## 2023-08-23 NOTE — Telephone Encounter (Signed)
Patient called stating her lab shows that she has a UTI. Informed patient that she does not have a UTI and we will call her after her swab comes back if it shows an infection. Understanding was voiced. Nicole Kaiser l Hashim Eichhorst, CMA

## 2023-12-07 ENCOUNTER — Telehealth: Admitting: Physician Assistant

## 2023-12-07 DIAGNOSIS — K529 Noninfective gastroenteritis and colitis, unspecified: Secondary | ICD-10-CM

## 2023-12-07 MED ORDER — DICYCLOMINE HCL 10 MG PO CAPS: ORAL_CAPSULE | ORAL | 0 refills | Status: DC

## 2023-12-07 NOTE — Progress Notes (Signed)
 Virtual Visit Consent   [name], you are scheduled for a virtual visit with a [facility] provider today. Just as with appointments in the office, your consent must be obtained to participate. Your consent will be active for this visit and any virtual visit you may have with one of our providers in the next 365 days. If you have a MyChart account, a copy of this consent can be sent to you electronically.  As this is a virtual visit, video technology does not allow for your provider to perform a traditional examination. This may limit your provider's ability to fully assess your condition. If your provider identifies any concerns that need to be evaluated in person or the need to arrange testing (such as labs, EKG, etc.), we will make arrangements to do so. Although advances in technology are sophisticated, we cannot ensure that it will always work on either your end or our end. If the connection with a video visit is poor, the visit may have to be switched to a telephone visit. With either a video or telephone visit, we are not always able to ensure that we have a secure connection.  By engaging in this virtual visit, you consent to the provision of healthcare and authorize for your insurance to be billed (if applicable) for the services provided during this visit. Depending on your insurance coverage, you may receive a charge related to this service.  I need to obtain your verbal consent now. Are you willing to proceed with your visit today? Nicole Kaiser has provided verbal consent on 12/07/2023 for a virtual visit (video or telephone). Nicole Kaiser, [place]  Date: 12/07/2023 12:59 PM   Virtual Visit via Video Note   I, Nicole Kaiser, connected with  Nicole Kaiser  ([id number], [birth year]) on 12/07/23 at 12:30 PM EDT by a video-enabled telemedicine application and verified that I am speaking with the correct person using two identifiers.  Location: Patient: Virtual Visit Location  Patient: Home Provider: Virtual Visit Location Provider: Home Office   I discussed the limitations of evaluation and management by telemedicine and the availability of in person appointments. The patient expressed understanding and agreed to proceed.    History of Present Illness: Nicole Kaiser is a 38 y.o. who identifies as a female who was assigned female at birth, and is being seen today for 2 days of GI symptoms after drinking some smooth move tea and lemon water. Felt fine that day but some abdominal cramping later that night into yesterday morning. Yesterday with some substantial diarrhea (> 10 times). Last night noting some mild blood in the stool and with wiping. Described as bright red.  Still some continued cramping. Frequency of stool returning to normal. Did take some Pepto this morning which has helped some. Denies fevers but some sweats. Denies nausea, heartburn or indigestion.  Has history of IDA, not currently on a supplement. Denies SOB, CP.  HPI: HPI  Problems:  Patient Active Problem List   Diagnosis Date Noted   Prediabetes 01/23/2020   History of uterine fibroid 11/11/2016   History of iron deficiency anemia 11/11/2016   Menorrhagia with regular cycle 11/11/2016    Allergies:  Allergies  Allergen Reactions   Diflucan [Fluconazole]     Possible allergic reaction with eye swelling only    Medications:  Current Outpatient Medications:    albuterol (VENTOLIN HFA) 108 (90 Base) MCG/ACT inhaler, Inhale 2 puffs into the lungs every 6 (six) hours as needed  for wheezing or shortness of breath., Disp: 6.7 g, Rfl: 3   Multiple Vitamins-Minerals (MULTIVITAMIN WITH MINERALS) tablet, Take 1 tablet by mouth daily., Disp: , Rfl:   Observations/Objective: Patient is well-developed, well-nourished in no acute distress.  Resting comfortably at home.  Head is normocephalic, atraumatic.  No labored breathing. Speech is clear and coherent with logical content.  Patient is alert and  oriented at baseline.   Assessment and Plan: 1. Gastroenteritis (Primary)  Mild sensitivity to special tea to move her bowels. Supportive measures and OTC medications reviewed. Can switch to Imodium OTC. Will add on bentyl to use if cramping not controlled with Imodium. BRAT diet reviewed. Strict ER precautions discussed.   Follow Up Instructions: I discussed the assessment and treatment plan with the patient. The patient was provided an opportunity to ask questions and all were answered. The patient agreed with the plan and demonstrated an understanding of the instructions.  A copy of instructions were sent to the patient via MyChart unless otherwise noted below.   The patient was advised to call back or seek an in-person evaluation if the symptoms worsen or if the condition fails to improve as anticipated.    Nicole Climes, PA-C

## 2023-12-07 NOTE — Patient Instructions (Signed)
 Nicole Kaiser, thank you for joining [name], PA-C for today's virtual visit.  While this provider is not your primary care provider (PCP), if your PCP is located in our provider database this encounter information will be shared with them immediately following your visit.   A [facility] MyChart account gives you access to today's visit and all your visits, tests, and labs performed at [facility] " click here if you don't have a [facility] MyChart account or go to [email or web address]  Consent: (Patient) Nicole Kaiser provided verbal consent for this virtual visit at the beginning of the encounter.  Current Medications:  Current Outpatient Medications:    albuterol (VENTOLIN HFA) 108 (90 Base) MCG/ACT inhaler, Inhale 2 puffs into the lungs every 6 (six) hours as needed for wheezing or shortness of breath., Disp: 6.7 g, Rfl: 3   Multiple Vitamins-Minerals (MULTIVITAMIN WITH MINERALS) tablet, Take 1 tablet by mouth daily., Disp: , Rfl:    Medications ordered in this encounter:  No orders of the defined types were placed in this encounter.    *If you need refills on other medications prior to your next appointment, please contact your pharmacy*  Follow-Up: Call back or seek an in-person evaluation if the symptoms worsen or if the condition fails to improve as anticipated.  [facility] [phone]  Other Instructions Food Choices to Help Relieve Diarrhea, Adult Diarrhea can make you feel weak and cause you to become dehydrated. Dehydration is a condition in which there is not enough water or other fluids in the body. It is important to choose the right foods and drinks to: Relieve diarrhea. Replace lost fluids and nutrients. Prevent dehydration. What are tips for following this plan? Relieving diarrhea Avoid foods that make your diarrhea worse. These may include: Foods and drinks that are sweetened with high-fructose corn syrup, honey, or  sweeteners such as xylitol, sorbitol, and mannitol. Check food labels for these ingredients. Fried, greasy, or spicy foods. Raw fruits and vegetables. Eat foods that are rich in probiotics. These include foods such as yogurt and fermented milk products. Probiotics can help increase healthy bacteria in your stomach and intestines (gastrointestinal or GI tract). This may help digestion and stop diarrhea. If you have lactose intolerance, avoid dairy products. These may make your diarrhea worse. Take medicine to help stop diarrhea only as told by your health care provider. Replacing nutrients  Eat bland, easy-to-digest foods in small amounts as you are able, until your diarrhea starts to get better. These foods include bananas, applesauce, rice, toast, and crackers. Over time, add nutrient-rich foods as your body tolerates them or as told by your health care provider. These include: Well-cooked protein foods, such as eggs, lean meats like fish or chicken without skin, and tofu. Peeled, seeded, and soft-cooked fruits and vegetables. Low-fat dairy products. Whole grains. Take vitamin and mineral supplements as told by your health care provider. Preventing dehydration  Start by sipping water or a solution to prevent dehydration (oral rehydration solution, or ORS). This is a drink that helps replace fluids and minerals your body has lost. You can buy an ORS at pharmacies and retail stores. Try to drink at least 8-10 cups (2,000-2,500 mL) of fluid each day to help replace lost fluids. If your urine is pale yellow, you are getting enough fluids. You may drink other liquids in addition to water, such as fruit juice that you have added water to (diluted fruit juice) or low-calorie sports drinks, as  tolerated or as told by your health care provider. Avoid drinks with caffeine, such as coffee, tea, or soft drinks. Avoid alcohol. This information is not intended to replace advice given to you by your health  care provider. Make sure you discuss any questions you have with your health care provider. Document Revised: 02/17/2022 Document Reviewed: 02/17/2022 Elsevier Patient Education  2024 Elsevier Inc.   If you have been instructed to have an in-person evaluation today at a local Urgent Care facility, please use the link below. It will take you to a list of all of our available [facility], including address, phone number and hours of operation. Please do not delay care.  [facility]  If you or a family member do not have a primary care provider, use the link below to schedule a visit and establish care. When you choose a [facility] primary care physician or advanced practice provider, you gain a long-term partner in health. Find a Primary Care Provider  Learn more about [facility]'s in-office and virtual care options: [facility] - Get Care Now

## 2024-01-16 ENCOUNTER — Encounter: Payer: Self-pay | Admitting: Emergency Medicine

## 2024-01-16 ENCOUNTER — Ambulatory Visit
Admission: EM | Admit: 2024-01-16 | Discharge: 2024-01-16 | Disposition: A | Discharge status: Home / Self Care | Attending: Nurse Practitioner | Admitting: Nurse Practitioner

## 2024-01-16 DIAGNOSIS — N898 Other specified noninflammatory disorders of vagina: Secondary | ICD-10-CM | POA: Diagnosis present

## 2024-01-16 DIAGNOSIS — R8281 Pyuria: Secondary | ICD-10-CM | POA: Diagnosis present

## 2024-01-16 DIAGNOSIS — N760 Acute vaginitis: Secondary | ICD-10-CM | POA: Diagnosis not present

## 2024-01-16 LAB — POCT URINALYSIS DIP (MANUAL ENTRY)
Bilirubin, UA: NEGATIVE
Glucose, UA: NEGATIVE mg/dL
Ketones, POC UA: NEGATIVE mg/dL
Nitrite, UA: NEGATIVE
Protein Ur, POC: 30 mg/dL — AB
Spec Grav, UA: 1.025
Urobilinogen, UA: 0.2 U/dL
pH, UA: 7

## 2024-01-16 LAB — POCT URINE PREGNANCY: Preg Test, Ur: NEGATIVE

## 2024-01-16 MED ORDER — METRONIDAZOLE 500 MG PO TABS: 500.0000 mg | ORAL_TABLET | Freq: Two times a day (BID) | ORAL | 0 refills | Status: DC

## 2024-01-16 MED ORDER — MICONAZOLE NITRATE 200 MG VA SUPP: 200.0000 mg | Freq: Every day | VAGINAL | 0 refills | Status: DC

## 2024-01-16 NOTE — Patient Instructions (Incomplete)
 It was great to see you again today, I will be in touch with your lab work  I think seeing the nutritionist is a good idea  We can start you on oral contraceptive pills (OCP) - take one daily for pregnancy prevention Let me know how these work for you Use condoms the first 2 weeks of use and take a home pregnancy test after 2 weeks if any question

## 2024-01-16 NOTE — Discharge Instructions (Signed)
 You were seen today for vaginal discharge, which can occur when the normal balance of bacteria in the vagina changes. This can be influenced by a number of factors, including new or multiple sexual partners, unprotected sex, douching, smoking, certain antibiotics, or pregnancy. It's also possible to develop a vaginal infection even without being sexually active.  Tests were performed today to check for bacteria, yeast, gonorrhea, chlamydia, and trichomonas. While results are pending, treatment has been started for the most common non-STD causes--bacterial vaginosis and yeast infection--based on your symptoms and the absence of concern for a sexually transmitted infection.  During this time, avoid douching or using vaginal sprays or deodorants. Wear cotton or cotton-lined underwear to improve airflow and reduce moisture. If you are sexually active, using condoms or dental dams can help protect you. Make sure to stay hydrated by drinking plenty of fluids.  You will only be contacted if any of your test results are positive. You can also review your results through your MyChart account.

## 2024-01-16 NOTE — ED Triage Notes (Signed)
 Pt c/o yeast infection sxs x 4 days. The only sxs she noticed was discharge.

## 2024-01-16 NOTE — Progress Notes (Unsigned)
 [facility] [address], [address] [place], [place] [phone]  Date:  01/19/2024   Name:  Nicole Kaiser   DOB:  07/19/1986   MRN:  [id number]  PCP:  Kaylee Partridge, MD    Chief Complaint: No chief complaint on file.   History of Present Illness:  Nicole Kaiser is a 38 y.o. very pleasant female patient who presents with the following:  Patient seen today for physical exam Most recent visit with myself was about 1 year ago History of prediabetes, menorrhagia, fibroids- She has GYN care at the [facility] for women  When I saw her last year she was grieving the loss of her mother, was feeling poorly and easily distracted.  We set her up with a 2-week bereavement leave  Since her last visit she has been seeing podiatry for some issues with bone spurs and ingrown toenails  Her son is 8, in second grade  Due for follow-up labs Pap 2 years ago-negative, negative HPV She had a breast biopsy 2 years ago-pathology negative.?  What was she told as far as further follow-up    Patient Active Problem List   Diagnosis Date Noted   Prediabetes 01/23/2020   History of uterine fibroid 11/11/2016   History of iron  deficiency anemia 11/11/2016   Menorrhagia with regular cycle 11/11/2016    Past Medical History:  Diagnosis Date   Cervical dysplasia 2013   Normal follow up paps   Chicken pox    History of UTI    Hypertension affecting pregnancy    Vaginal Pap smear, abnormal     Past Surgical History:  Procedure Laterality Date   CESAREAN SECTION  02/14/2016   colposopy      Social History   Tobacco Use   Smoking status: Never   Smokeless tobacco: Never  Vaping Use   Vaping status: Never Used  Substance Use Topics   Alcohol use: Yes    Alcohol/week: 1.0 standard drink of alcohol    Types: 1 Glasses of wine per week   Drug use: No    Family History  Problem Relation Age of Onset    Alcoholism Father    Diabetes Maternal Grandmother    Cancer Neg Hx    Hypertension Neg Hx    Stroke Neg Hx     Allergies  Allergen Reactions   Diflucan  [Fluconazole ]     Possible allergic reaction with eye swelling only     Medication list has been reviewed and updated.  Current Outpatient Medications on File Prior to Visit  Medication Sig Dispense Refill   albuterol  (VENTOLIN  HFA) 108 (90 Base) MCG/ACT inhaler Inhale 2 puffs into the lungs every 6 (six) hours as needed for wheezing or shortness of breath. 6.7 g 3   dicyclomine  (BENTYL ) 10 MG capsule Take 1 capsule up to three times daily before meals as needed. 15 capsule 0   Multiple Vitamins-Minerals (MULTIVITAMIN WITH MINERALS) tablet Take 1 tablet by mouth daily.     No current facility-administered medications on file prior to visit.    Review of Systems:  As per HPI- otherwise negative.   Physical Examination: There were no vitals filed for this visit. There were no vitals filed for this visit. There is no height or weight on file to calculate BMI. Ideal Body Weight:    GEN: no acute distress. HEENT: Atraumatic, Normocephalic.  Ears and Nose: No external  deformity. CV: RRR, No M/G/R. No JVD. No thrill. No extra heart sounds. PULM: CTA B, no wheezes, crackles, rhonchi. No retractions. No resp. distress. No accessory muscle use. ABD: S, NT, ND, +BS. No rebound. No HSM. EXTR: No c/c/e PSYCH: Normally interactive. Conversant.    Assessment and Plan: *** Physical exam.  Encouraged healthy diet and exercise routine Will plan further follow- up pending labs.  Signed Gates Kasal, MD

## 2024-01-16 NOTE — ED Provider Notes (Signed)
 [facility]    CSN: [id number] Arrival date & time: 01/16/24  0911      History   Chief Complaint Chief Complaint  Patient presents with   Vaginitis    HPI Nicole Kaiser is a 38 y.o. female.   Subjective:   Nicole Kaiser is a 38 year old female who presents with complaints of vaginal itching, irritation, and discharge. Symptoms began five days ago, coinciding with the onset of her menstrual cycle. Two days ago, she noted the development of a thick, white vaginal discharge without any associated odor. She denies dysuria, vaginal pain, genital sores, urinary frequency, abdominal or low back pain, nausea, vomiting, or fever. She reports being sexually active with one female partner, with whom she does not use condoms. She is not on birth control and denies any concerns for sexually transmitted infections. She reports adequate hydration and takes cranberry supplements daily.  The following portions of the patient's history were reviewed and updated as appropriate: allergies, current medications, past family history, past medical history, past social history, past surgical history, and problem list.               Past Medical History:  Diagnosis Date   Cervical dysplasia 2013   Normal follow up paps   Chicken pox    History of UTI    Hypertension affecting pregnancy    Vaginal Pap smear, abnormal     Patient Active Problem List   Diagnosis Date Noted   Prediabetes 01/23/2020   History of uterine fibroid 11/11/2016   History of iron  deficiency anemia 11/11/2016   Menorrhagia with regular cycle 11/11/2016    Past Surgical History:  Procedure Laterality Date   CESAREAN SECTION  02/14/2016   colposopy      OB History     Gravida  1   Para  1   Term      Preterm      AB      Living         SAB      IAB      Ectopic      Multiple      Live Births  1            Home Medications    Prior to Admission medications    Medication Sig Start Date End Date Taking? Authorizing Provider  albuterol  (VENTOLIN  HFA) 108 (90 Base) MCG/ACT inhaler Inhale 2 puffs into the lungs every 6 (six) hours as needed for wheezing or shortness of breath. 12/31/22   Copland, Skipper Dumas, MD  dicyclomine  (BENTYL ) 10 MG capsule Take 1 capsule up to three times daily before meals as needed. 12/07/23   Farris Hong, PA-C  metroNIDAZOLE  (FLAGYL ) 500 MG tablet Take 1 tablet (500 mg total) by mouth 2 (two) times daily. 01/16/24  Yes Maryruth Sol, FNP  miconazole (MICOTIN) 200 MG vaginal suppository Place 1 suppository (200 mg total) vaginally at bedtime for 3 days. 01/16/24 01/19/24 Yes Maryruth Sol, FNP  Multiple Vitamins-Minerals (MULTIVITAMIN WITH MINERALS) tablet Take 1 tablet by mouth daily.    [provider]    Family History Family History  Problem Relation Age of Onset   Alcoholism Father    Diabetes Maternal Grandmother    Cancer Neg Hx    Hypertension Neg Hx    Stroke Neg Hx     Social History Social History   Tobacco Use   Smoking status: Never    Passive exposure: Never  Smokeless tobacco: Never  Vaping Use   Vaping status: Never Used  Substance Use Topics   Alcohol use: Yes    Alcohol/week: 1.0 standard drink of alcohol    Types: 1 Glasses of wine per week   Drug use: No     Allergies   Diflucan  [fluconazole ]   Review of Systems Review of Systems  Constitutional:  Negative for fever.  Gastrointestinal:  Negative for abdominal pain, nausea and vomiting.  Genitourinary:  Positive for vaginal discharge. Negative for difficulty urinating, dysuria, flank pain, frequency, genital sores and urgency.  Musculoskeletal:  Negative for back pain.  All other systems reviewed and are negative.    Physical Exam Triage Vital Signs ED Triage Vitals  Encounter Vitals Group     BP 01/16/24 0928 115/76     Systolic BP Percentile --      Diastolic BP Percentile --      Pulse Rate 01/16/24 0928 75      Resp 01/16/24 0928 16     Temp 01/16/24 0928 97.9 F (36.6 C)     Temp Source 01/16/24 0928 Oral     SpO2 01/16/24 0928 98 %     Weight 01/16/24 0928 183 lb 3.2 oz (83.1 kg)     Height --      Head Circumference --      Peak Flow --      Pain Score 01/16/24 0926 9     Pain Loc --      Pain Education --      Exclude from Growth Chart --    No data found.  Updated Vital Signs BP 115/76 (BP Location: Left Arm)   Pulse 75   Temp 97.9 F (36.6 C) (Oral)   Resp 16   Wt 183 lb 3.2 oz (83.1 kg)   LMP 01/11/2024 (Exact Date)   SpO2 98%   BMI 34.62 kg/m   Visual Acuity Right Eye Distance:   Left Eye Distance:   Bilateral Distance:    Right Eye Near:   Left Eye Near:    Bilateral Near:     Physical Exam Constitutional:      General: She is not in acute distress.    Appearance: Normal appearance. She is not ill-appearing, toxic-appearing or diaphoretic.  HENT:     Head: Normocephalic.     Nose: Nose normal.     Mouth/Throat:     Mouth: Mucous membranes are moist.  Eyes:     Conjunctiva/sclera: Conjunctivae normal.  Cardiovascular:     Rate and Rhythm: Normal rate.  Pulmonary:     Effort: Pulmonary effort is normal.  Abdominal:     Palpations: Abdomen is soft.  Genitourinary:    Comments: Deferred; patient performed self-swab for Aptima testing  Musculoskeletal:        General: Normal range of motion.     Cervical back: Normal range of motion and neck supple.  Skin:    General: Skin is warm and dry.  Neurological:     General: No focal deficit present.     Mental Status: She is alert and oriented to person, place, and time.  Psychiatric:        Mood and Affect: Mood normal.        Behavior: Behavior normal.      UC Treatments / Results  Labs (all labs ordered are listed, but only abnormal results are displayed) Labs Reviewed  POCT URINALYSIS DIP (MANUAL ENTRY) - Abnormal; Notable for the following components:  Result Value   Clarity, UA cloudy  (*)    Blood, UA small (*)    Protein Ur, POC =30 (*)    Leukocytes, UA Trace (*)    All other components within normal limits  POCT URINE PREGNANCY - Normal  URINE CULTURE  CERVICOVAGINAL ANCILLARY ONLY    EKG   Radiology No results found.  Procedures Procedures (including critical care time)  Medications Ordered in UC Medications - No data to display  Initial Impression / Assessment and Plan / UC Course  I have reviewed the triage vital signs and the nursing notes.  Pertinent labs & imaging results that were available during my care of the patient were reviewed by me and considered in my medical decision making (see chart for details).     38 year old female presenting with a three-day history of vaginal itching, irritation, and discharge. She is afebrile and nontoxic. Physical exam as above. GU exam was deferred. The patient performed a self-swab for Aptima testing. Urinalysis reveals cloudy urine with small red blood cells and trace leukocytes, but no nitrites. Urine pregnancy test is negative. Urine culture and Aptima testing are pending. Given the patient's symptoms, absence of dysuria, and lack of concern for STIs, she is being empirically treated for both bacterial vaginosis and yeast infection. Due to a reported allergy to Diflucan  resulting in right eye swelling, and tolerability of other azole antifungals, treatment includes Flagyl  twice daily for 7 days and miconazole vaginal suppositories nightly for 3 days. Final treatment decisions will be guided by pending test results.  Today's evaluation has revealed no signs of a dangerous process. Discussed diagnosis with patient and/or guardian. Patient and/or guardian aware of their diagnosis, possible red flag symptoms to watch out for and need for close follow up. Patient and/or guardian understands verbal and written discharge instructions. Patient and/or guardian comfortable with plan and disposition.  Patient and/or guardian  has a clear mental status at this time, good insight into illness (after discussion and teaching) and has clear judgment to make decisions regarding their care  Documentation was completed with the aid of voice recognition software. Transcription may contain typographical errors. Final Clinical Impressions(s) / UC Diagnoses   Final diagnoses:  Vaginal discharge  Vaginitis and vulvovaginitis  Pyuria     Discharge Instructions      You were seen today for vaginal discharge, which can occur when the normal balance of bacteria in the vagina changes. This can be influenced by a number of factors, including new or multiple sexual partners, unprotected sex, douching, smoking, certain antibiotics, or pregnancy. It's also possible to develop a vaginal infection even without being sexually active.  Tests were performed today to check for bacteria, yeast, gonorrhea, chlamydia, and trichomonas. While results are pending, treatment has been started for the most common non-STD causes--bacterial vaginosis and yeast infection--based on your symptoms and the absence of concern for a sexually transmitted infection.  During this time, avoid douching or using vaginal sprays or deodorants. Wear cotton or cotton-lined underwear to improve airflow and reduce moisture. If you are sexually active, using condoms or dental dams can help protect you. Make sure to stay hydrated by drinking plenty of fluids.  You will only be contacted if any of your test results are positive. You can also review your results through your MyChart account.     ED Prescriptions     Medication Sig Dispense Auth. Provider   metroNIDAZOLE  (FLAGYL ) 500 MG tablet Take 1 tablet (500 mg total) by  mouth 2 (two) times daily. 14 tablet Emalie Mcwethy, Foster, FNP   miconazole (MICOTIN) 200 MG vaginal suppository Place 1 suppository (200 mg total) vaginally at bedtime for 3 days. 3 suppository Maryruth Sol, FNP      PDMP not reviewed this  encounter.   Maryruth Sol, [place] 01/16/24 1054

## 2024-01-17 ENCOUNTER — Telehealth: Payer: Self-pay | Admitting: Emergency Medicine

## 2024-01-17 LAB — CERVICOVAGINAL ANCILLARY ONLY
Bacterial Vaginitis (gardnerella): POSITIVE — AB
Candida Glabrata: NEGATIVE
Candida Vaginitis: POSITIVE — AB
Chlamydia: NEGATIVE
Comment: NEGATIVE
Comment: NEGATIVE
Comment: NEGATIVE
Comment: NEGATIVE
Comment: NEGATIVE
Comment: NORMAL
Neisseria Gonorrhea: NEGATIVE
Trichomonas: NEGATIVE

## 2024-01-17 LAB — URINE CULTURE: Culture: NO GROWTH

## 2024-01-17 MED ORDER — MICONAZOLE NITRATE 200 MG VA SUPP: 200.0000 mg | Freq: Every day | VAGINAL | 0 refills | Status: DC

## 2024-01-17 NOTE — Telephone Encounter (Signed)
 Pt called in and stated that her pharmacy does not have the miconazole in stock. She's asking if we can send it to [facility] on [address]

## 2024-01-18 ENCOUNTER — Ambulatory Visit

## 2024-01-18 ENCOUNTER — Telehealth: Payer: Self-pay | Admitting: Family Medicine

## 2024-01-18 ENCOUNTER — Other Ambulatory Visit (HOSPITAL_BASED_OUTPATIENT_CLINIC_OR_DEPARTMENT_OTHER): Payer: Self-pay

## 2024-01-18 MED ORDER — TERCONAZOLE 0.4 % VA CREA: 1.0000 | TOPICAL_CREAM | Freq: Every day | VAGINAL | 0 refills | Status: DC

## 2024-01-18 MED ORDER — TERCONAZOLE 0.4 % VA CREA
1.0000 | TOPICAL_CREAM | Freq: Every day | VAGINAL | 1 refills | Status: DC
  Filled 2024-01-18: qty 45, 30d supply, fill #0

## 2024-01-18 NOTE — Telephone Encounter (Signed)
 Saved a message from the front desk that patient arrived in clinic requesting a different treatment covered by her insurance for a yeast infection.  Apparently patient was prescribed miconazole which is not covered by her insurance and initially was not available at the pharmacy when she went to pick it up last week.  Chart reviewed sent over terconazole  which patient has been previously prescribed for yeast to [facility] per patient's request.

## 2024-01-19 ENCOUNTER — Other Ambulatory Visit (HOSPITAL_BASED_OUTPATIENT_CLINIC_OR_DEPARTMENT_OTHER): Payer: Self-pay

## 2024-01-19 ENCOUNTER — Encounter: Payer: Self-pay | Admitting: Family Medicine

## 2024-01-19 ENCOUNTER — Ambulatory Visit (INDEPENDENT_AMBULATORY_CARE_PROVIDER_SITE_OTHER): Admitting: Family Medicine

## 2024-01-19 ENCOUNTER — Other Ambulatory Visit: Payer: Self-pay

## 2024-01-19 VITALS — BP 130/72 | HR 85 | Temp 98.0°F | Resp 18 | Ht 61.0 in | Wt 191.2 lb

## 2024-01-19 DIAGNOSIS — Z130 Encounter for screening for diseases of the blood and blood-forming organs and certain disorders involving the immune mechanism: Secondary | ICD-10-CM

## 2024-01-19 DIAGNOSIS — Z309 Encounter for contraceptive management, unspecified: Secondary | ICD-10-CM

## 2024-01-19 DIAGNOSIS — Z0000 Encounter for general adult medical examination without abnormal findings: Secondary | ICD-10-CM | POA: Diagnosis not present

## 2024-01-19 DIAGNOSIS — R062 Wheezing: Secondary | ICD-10-CM

## 2024-01-19 DIAGNOSIS — E611 Iron deficiency: Secondary | ICD-10-CM | POA: Diagnosis not present

## 2024-01-19 DIAGNOSIS — Z1329 Encounter for screening for other suspected endocrine disorder: Secondary | ICD-10-CM

## 2024-01-19 DIAGNOSIS — D509 Iron deficiency anemia, unspecified: Secondary | ICD-10-CM

## 2024-01-19 DIAGNOSIS — Z13220 Encounter for screening for lipoid disorders: Secondary | ICD-10-CM | POA: Diagnosis not present

## 2024-01-19 DIAGNOSIS — Z131 Encounter for screening for diabetes mellitus: Secondary | ICD-10-CM

## 2024-01-19 LAB — CBC
HCT: 32.5 % — ABNORMAL LOW (ref 36.0–46.0)
Hemoglobin: 10.2 g/dL — ABNORMAL LOW (ref 12.0–15.0)
MCHC: 31.4 g/dL (ref 30.0–36.0)
MCV: 85.2 fl (ref 78.0–100.0)
Platelets: 418 10*3/uL — ABNORMAL HIGH (ref 150.0–400.0)
RBC: 3.81 Mil/uL — ABNORMAL LOW (ref 3.87–5.11)
RDW: 17.1 % — ABNORMAL HIGH (ref 11.5–15.5)
WBC: 6.4 10*3/uL (ref 4.0–10.5)

## 2024-01-19 LAB — LIPID PANEL
Cholesterol: 174 mg/dL (ref 0–200)
HDL: 31.4 mg/dL — ABNORMAL LOW (ref >39.00)
LDL Cholesterol: 114 mg/dL — ABNORMAL HIGH (ref 0–99)
NonHDL: 142.42
Total CHOL/HDL Ratio: 6
Triglycerides: 144 mg/dL (ref 0.0–149.0)
VLDL: 28.8 mg/dL (ref 0.0–40.0)

## 2024-01-19 LAB — COMPREHENSIVE METABOLIC PANEL WITH GFR
ALT: 9 U/L (ref 0–35)
AST: 16 U/L (ref 0–37)
Albumin: 4.3 g/dL (ref 3.5–5.2)
Alkaline Phosphatase: 67 U/L (ref 39–117)
BUN: 12 mg/dL (ref 6–23)
CO2: 25 meq/L (ref 19–32)
Calcium: 9 mg/dL (ref 8.4–10.5)
Chloride: 104 meq/L (ref 96–112)
Creatinine, Ser: 0.78 mg/dL (ref 0.40–1.20)
GFR: 98.11 mL/min (ref >60.00)
Glucose, Bld: 108 mg/dL — ABNORMAL HIGH (ref 70–99)
Potassium: 4.3 meq/L (ref 3.5–5.1)
Sodium: 135 meq/L (ref 135–145)
Total Bilirubin: 0.3 mg/dL (ref 0.2–1.2)
Total Protein: 7.7 g/dL (ref 6.0–8.3)

## 2024-01-19 LAB — POCT URINE PREGNANCY: Preg Test, Ur: NEGATIVE

## 2024-01-19 LAB — FERRITIN: Ferritin: 9.3 ng/mL — ABNORMAL LOW (ref 10.0–291.0)

## 2024-01-19 LAB — HEMOGLOBIN A1C: Hgb A1c MFr Bld: 6.3 % (ref 4.6–6.5)

## 2024-01-19 LAB — TSH: TSH: 1.07 u[IU]/mL (ref 0.35–5.50)

## 2024-01-19 MED ORDER — LEVONORGESTREL-ETHINYL ESTRAD 0.1-20 MG-MCG PO TABS
1.0000 | ORAL_TABLET | Freq: Every day | ORAL | 3 refills | Status: DC
Start: 2024-01-19 — End: 2024-01-22
  Filled 2024-01-19: qty 28, 28d supply, fill #0

## 2024-01-19 MED ORDER — ALBUTEROL SULFATE HFA 108 (90 BASE) MCG/ACT IN AERS
2.0000 | INHALATION_SPRAY | Freq: Four times a day (QID) | RESPIRATORY_TRACT | 3 refills | Status: DC | PRN
  Filled 2024-01-19: qty 6.7, 25d supply, fill #0

## 2024-01-22 ENCOUNTER — Other Ambulatory Visit (HOSPITAL_BASED_OUTPATIENT_CLINIC_OR_DEPARTMENT_OTHER): Payer: Self-pay

## 2024-01-22 MED ORDER — NORETHINDRONE 0.35 MG PO TABS
1.0000 | ORAL_TABLET | Freq: Every day | ORAL | 3 refills | Status: DC
  Filled 2024-01-22: qty 28, 28d supply, fill #0

## 2024-01-23 ENCOUNTER — Other Ambulatory Visit: Payer: Self-pay

## 2024-01-24 ENCOUNTER — Other Ambulatory Visit (HOSPITAL_BASED_OUTPATIENT_CLINIC_OR_DEPARTMENT_OTHER): Payer: Self-pay

## 2024-02-03 ENCOUNTER — Ambulatory Visit: Admitting: Family Medicine

## 2024-03-15 ENCOUNTER — Ambulatory Visit: Admitting: Family Medicine

## 2024-04-03 ENCOUNTER — Ambulatory Visit: Admitting: Family Medicine

## 2024-04-20 ENCOUNTER — Ambulatory Visit: Admitting: Family Medicine

## 2024-04-27 ENCOUNTER — Encounter

## 2024-04-27 ENCOUNTER — Other Ambulatory Visit (HOSPITAL_COMMUNITY)
Admission: RE | Admit: 2024-04-27 | Discharge: 2024-04-27 | Disposition: A | Discharge status: Home / Self Care | Source: Ambulatory Visit | Attending: Obstetrics and Gynecology | Admitting: Obstetrics and Gynecology

## 2024-04-27 ENCOUNTER — Ambulatory Visit (INDEPENDENT_AMBULATORY_CARE_PROVIDER_SITE_OTHER)

## 2024-04-27 VITALS — BP 115/76 | HR 77 | Ht 61.0 in | Wt 185.0 lb

## 2024-04-27 DIAGNOSIS — N898 Other specified noninflammatory disorders of vagina: Secondary | ICD-10-CM

## 2024-04-27 DIAGNOSIS — R350 Frequency of micturition: Secondary | ICD-10-CM | POA: Diagnosis not present

## 2024-04-27 LAB — POCT URINALYSIS DIPSTICK
Bilirubin, UA: NEGATIVE
Blood, UA: NEGATIVE
Glucose, UA: NEGATIVE
Ketones, UA: 80
Nitrite, UA: NEGATIVE
Protein, UA: POSITIVE — AB
Spec Grav, UA: 1.02 (ref 1.010–1.025)
Urobilinogen, UA: NEGATIVE U/dL — AB
pH, UA: 6.5 (ref 5.0–8.0)

## 2024-04-27 MED ORDER — NITROFURANTOIN MONOHYD MACRO 100 MG PO CAPS: 100.0000 mg | ORAL_CAPSULE | Freq: Two times a day (BID) | ORAL | 0 refills | Status: DC

## 2024-04-27 NOTE — Addendum Note (Signed)
 Addended by: JOMARIE SKIPPER D on: 04/27/2024 03:05 PM   Modules accepted: Orders

## 2024-04-27 NOTE — Progress Notes (Signed)
 SUBJECTIVE:  38 y.o. female complains of white vaginal discharge for 7 day(s) and urinary frequency Denies abnormal vaginal bleeding or significant pelvic pain or fever.Denies history of known exposure to STD.  Patient's last menstrual period was 03/27/2024 (exact date).  OBJECTIVE:  She appears alert, well appearing, in no apparent distress Urine dipstick: positive for leukocytes.  ASSESSMENT:  Vaginal Discharge  Vaginal Odor Dysuria    PLAN:  GC, chlamydia, trichomonas, BVAG, CVAG probe and urine culture sent to lab. Treatment: To be determined once lab results are received ROV prn if symptoms persist or worsen.   Shawnee Fleet, CMA

## 2024-04-28 LAB — CERVICOVAGINAL ANCILLARY ONLY
Bacterial Vaginitis (gardnerella): POSITIVE — AB
Candida Glabrata: NEGATIVE
Candida Vaginitis: POSITIVE — AB
Chlamydia: NEGATIVE
Comment: NEGATIVE
Comment: NEGATIVE
Comment: NEGATIVE
Comment: NEGATIVE
Comment: NEGATIVE
Comment: NORMAL
Neisseria Gonorrhea: NEGATIVE
Trichomonas: NEGATIVE

## 2024-04-29 ENCOUNTER — Ambulatory Visit: Payer: Self-pay | Admitting: Obstetrics and Gynecology

## 2024-04-29 LAB — URINE CULTURE

## 2024-04-29 MED ORDER — METRONIDAZOLE 500 MG PO TABS
500.0000 mg | ORAL_TABLET | Freq: Two times a day (BID) | ORAL | 0 refills | Status: DC
  Filled 2024-04-29: qty 14, 7d supply, fill #0

## 2024-04-29 MED ORDER — TERCONAZOLE 0.8 % VA CREA
1.0000 | TOPICAL_CREAM | Freq: Every day | VAGINAL | 0 refills | Status: DC
Start: 2024-04-29 — End: 2024-05-17
  Filled 2024-04-29: qty 20, 3d supply, fill #0

## 2024-05-01 ENCOUNTER — Other Ambulatory Visit (HOSPITAL_BASED_OUTPATIENT_CLINIC_OR_DEPARTMENT_OTHER): Payer: Self-pay

## 2024-05-08 ENCOUNTER — Other Ambulatory Visit: Payer: Self-pay

## 2024-05-08 DIAGNOSIS — B9689 Other specified bacterial agents as the cause of diseases classified elsewhere: Secondary | ICD-10-CM

## 2024-05-08 MED ORDER — METRONIDAZOLE 0.75 % VA GEL: 1.0000 | Freq: Every day | VAGINAL | 0 refills | Status: AC

## 2024-05-12 ENCOUNTER — Other Ambulatory Visit: Payer: Self-pay

## 2024-05-12 ENCOUNTER — Other Ambulatory Visit (HOSPITAL_BASED_OUTPATIENT_CLINIC_OR_DEPARTMENT_OTHER): Payer: Self-pay

## 2024-05-12 DIAGNOSIS — Z3A01 Less than 8 weeks gestation of pregnancy: Secondary | ICD-10-CM

## 2024-05-12 MED ORDER — PROMETHAZINE HCL 25 MG PO TABS
25.0000 mg | ORAL_TABLET | Freq: Four times a day (QID) | ORAL | 2 refills | Status: AC | PRN
  Filled 2024-05-12: qty 30, 8d supply, fill #0

## 2024-05-17 ENCOUNTER — Inpatient Hospital Stay (HOSPITAL_COMMUNITY)

## 2024-05-17 ENCOUNTER — Inpatient Hospital Stay (HOSPITAL_COMMUNITY)
Admission: AD | Admit: 2024-05-17 | Discharge: 2024-05-17 | Disposition: A | Discharge status: Home / Self Care | Attending: Obstetrics & Gynecology | Admitting: Obstetrics & Gynecology

## 2024-05-17 ENCOUNTER — Other Ambulatory Visit (HOSPITAL_BASED_OUTPATIENT_CLINIC_OR_DEPARTMENT_OTHER): Payer: Self-pay

## 2024-05-17 ENCOUNTER — Encounter (HOSPITAL_COMMUNITY): Payer: Self-pay

## 2024-05-17 ENCOUNTER — Other Ambulatory Visit: Payer: Self-pay

## 2024-05-17 DIAGNOSIS — O208 Other hemorrhage in early pregnancy: Secondary | ICD-10-CM | POA: Insufficient documentation

## 2024-05-17 DIAGNOSIS — Z3A01 Less than 8 weeks gestation of pregnancy: Secondary | ICD-10-CM | POA: Insufficient documentation

## 2024-05-17 DIAGNOSIS — O99011 Anemia complicating pregnancy, first trimester: Secondary | ICD-10-CM | POA: Insufficient documentation

## 2024-05-17 DIAGNOSIS — D259 Leiomyoma of uterus, unspecified: Secondary | ICD-10-CM | POA: Insufficient documentation

## 2024-05-17 DIAGNOSIS — O09521 Supervision of elderly multigravida, first trimester: Secondary | ICD-10-CM | POA: Diagnosis not present

## 2024-05-17 DIAGNOSIS — O30041 Twin pregnancy, dichorionic/diamniotic, first trimester: Secondary | ICD-10-CM | POA: Diagnosis not present

## 2024-05-17 DIAGNOSIS — N939 Abnormal uterine and vaginal bleeding, unspecified: Secondary | ICD-10-CM | POA: Diagnosis not present

## 2024-05-17 DIAGNOSIS — O3411 Maternal care for benign tumor of corpus uteri, first trimester: Secondary | ICD-10-CM | POA: Diagnosis not present

## 2024-05-17 DIAGNOSIS — D649 Anemia, unspecified: Secondary | ICD-10-CM | POA: Insufficient documentation

## 2024-05-17 LAB — URINALYSIS, ROUTINE W REFLEX MICROSCOPIC
Bilirubin Urine: NEGATIVE
Glucose, UA: NEGATIVE mg/dL
Hgb urine dipstick: NEGATIVE
Ketones, ur: NEGATIVE mg/dL
Leukocytes,Ua: NEGATIVE
Nitrite: NEGATIVE
Protein, ur: NEGATIVE mg/dL
Specific Gravity, Urine: 1.011 (ref 1.005–1.030)
pH: 7 (ref 5.0–8.0)

## 2024-05-17 LAB — HCG, QUANTITATIVE, PREGNANCY: hCG, Beta Chain, Quant, S: 211269 m[IU]/mL — ABNORMAL HIGH (ref <5)

## 2024-05-17 LAB — CBC
HCT: 33.2 % — ABNORMAL LOW (ref 36.0–46.0)
Hemoglobin: 10.6 g/dL — ABNORMAL LOW (ref 12.0–15.0)
MCH: 28.4 pg (ref 26.0–34.0)
MCHC: 31.9 g/dL (ref 30.0–36.0)
MCV: 89 fL (ref 80.0–100.0)
Platelets: 302 K/uL (ref 150–400)
RBC: 3.73 MIL/uL — ABNORMAL LOW (ref 3.87–5.11)
RDW: 14.7 % (ref 11.5–15.5)
WBC: 7.8 K/uL (ref 4.0–10.5)
nRBC: 0 % (ref 0.0–0.2)

## 2024-05-17 LAB — WET PREP, GENITAL
Clue Cells Wet Prep HPF POC: NONE SEEN
Sperm: NONE SEEN
Trich, Wet Prep: NONE SEEN
WBC, Wet Prep HPF POC: <10 (ref <10)
Yeast Wet Prep HPF POC: NONE SEEN

## 2024-05-17 LAB — POCT PREGNANCY, URINE: Preg Test, Ur: POSITIVE — AB

## 2024-05-17 LAB — ABO/RH: ABO/RH(D): O POS

## 2024-05-17 MED ORDER — FERROUS SULFATE 325 (65 FE) MG PO TABS
ORAL_TABLET | ORAL | 11 refills | Status: AC
  Filled 2024-05-17: qty 30, 70d supply, fill #0

## 2024-05-17 NOTE — Discharge Instructions (Signed)
 Please establish prenatal care with an OB and start taking a daily prenatal vitamin.  [facility] [employer] for [employer] at [employer] [facility]             [address] [place], [place] [address] [phone]  Center for [facility] at [facility]                                                             [address], [address], [place], [place], [address] [phone]  Center for [facility] at [facility] [address], [place], [place], [address] [phone]  Center for [facility] at [facility] [address], [address], [place], [place], [address] [phone]  Center for [facility] at [facility]                                 [address] [place], [place], [place], [address] [phone]  Center for [facility] at [facility]                                    [address], [place], [place], [address] [phone]  Center for [facility] at [facility] [address], [place], [place], [address]                              [facility] [place] [address], [place], [place], [address] [phone]  [facility]         Phone: [phone]  [facility]      Phone: [phone]   [facility]      Phone: [phone]  [facility]         Phone: [phone]   [facility]    Phone: [phone]  [facility]      Phone: [phone]  [facility]     Phone: [phone]  [facility]        Phone: [phone]  [facility]) [phone]  [facility]      Phone: [phone]   Dos and Don'ts in Pregnancy  1. Prenatal Vitamins Pregnant women should consume the  following each day through diet or supplements: o Folic acid 400-800 micrograms  o Iron  30 mg (or be screened for anemia) o Vitamin D 600 international units o Calcium 1,000 mg Prenatal vitamins are unlikely to be harmful. Therefore, they may be used to ensure adequate consumption of several vitamins and minerals in pregnancy. However, their necessity for all pregnant women is uncertain, especially for women with well-balanced diets. There is no known ideal formulation for a prenatal vitamin.  2. Nutrition and Weight Gain Pregnant women should eat a healthy, well-balanced diet and typically should increase their caloric intake by a small  amount (350-450 calories/d). Typical weight gain goals vary based on pre-pregnancy body mass index (BMI)  3. Alcohol The exact threshold between safe and unsafe consumption of alcohol is unknown. Therefore, alcohol should be avoided in pregnancy.  4. Artificial Sweeteners Artificial sweeteners can be used in pregnancy. Data is conflicting. Low (typical) consumption of saccharin is likely safe.  5. Caffeine Low-to-moderate caffeine intake in pregnancy does not appear to be associated with any adverse outcomes. Pregnant women may have caffeine but should probably limit it to less than 300 mg/d (a typical 8-ounce cup of brewed coffee has approximately 130 mg of caffeine. An 8-ounce cup of tea or 12-ounce soda has approximately 50 mg of caffeine), but exact amounts vary based on the specific beverage or food.  6. Fish Consumption Pregnant women should try to consume two to three servings per week of fish with a high DHA (docosahexaenoic acid) and low mercury content. In line with current recommendations, pregnant women should generally avoid undercooked fish. However, sushi that was prepared in a clean and reputable establishment is unlikely to pose a risk to the pregnancy.  7. Other Foods to Avoid Pregnant women should avoid raw and undercooked  meat. Pregnant women should wash vegetables and fruit before eating them. Pregnant women should avoid unpasteurized dairy products. Unheated deli meats could also potentially increase the risk of Listeria, but the risk in recent years in uncertain. Pregnant women should avoid foods that are being recalled for possible Listeria contamination.  8. Smoking, Nicotine, and Vaping Women should not smoke cigarettes during pregnancy. If they are unable to quit entirely, they should reduce it as much as possible. Nicotine replacement (with patches or gum) is appropriate as part of a smoking cessation strategy.  9. Marijuana Marijuana use is not known to be associated with any adverse outcomes in pregnancy. However, data regarding long-term neurodevelopmental outcomes are lacking; therefore, marijuana use is currently not recommended in pregnancy.  10. Exercise and Bedrest Pregnant women are encouraged to exercise regularly. There is no known benefit to activity restriction or bedrest for pregnant women.  11. Avoiding Injury Pregnant women should wear lap and shoulder seatbelts while in a motor vehicle and should not disable their airbags.  12. Oral Health Oral health and dental procedures can continue as scheduled during pregnancy.  13. Hot Tubs and Swimming Although data are limited, pregnant women should probably avoid hot tub use in the first trimester. Swimming pool use should not be discouraged in pregnancy.  14. Insect Repellants Topical insect repellants (including DEET) can be used in pregnancy and should be used in areas with high risk for insect-borne illnesses.  15. Hair Dyes Although data are limited, because systemic absorption is minimal, hair dye is presumed to be safe in pregnancy.  16. Travel Airline travel is safe in pregnancy. Pregnant women should be familiar with the infection exposures and available medical care for each specific destination. There is no exact  gestational age at which women must stop travel. Each pregnant woman must balance the benefit of the trip with the potential of a complication at her destination.  17. Sexual Intercourse Pregnant women without bleeding, placenta previa at greater than 20 weeks of gestation, or ruptured membranes should not have restrictions regarding sexual intercourse.  18. Sleeping Position It is currently unknown whether, and at what gestational age, pregnant women should be advised to sleep on their side.  Source: Brinda Rankin EDISON MD. Dos and Don'ts in Pregnancy: Truths and Myths. Obstetrics & Gynecology 131(4):p  286-278, April 2018.  DOI: 10.1097/AOG.9999999999997482

## 2024-05-17 NOTE — MAU Provider Note (Signed)
 Chief Complaint:  Vaginal Bleeding   HPI   None     Nicole Kaiser is a 38 y.o. G2P1 at [redacted]w[redacted]d presenting to maternity admissions reporting vaginal bleeding. She endorses pink spotting on the toilet paper when she wiped this morning at 1130. They have not been passing blood clots, have not bled through their clothes, and have not passed any tissues. Denies abdominal pain. They have not had IUP confirmed with ultrasound. She was diagnosed with BV about 10 days ago and finished her antibiotics for it. Denies intercourse the past 48 hours.  Past Medical History:  Diagnosis Date   Cervical dysplasia 2013   Normal follow up paps   Chicken pox    History of UTI    Hypertension affecting pregnancy    Vaginal Pap smear, abnormal    OB History  Gravida Para Term Preterm AB Living  2 1      SAB IAB Ectopic Multiple Live Births      1    # Outcome Date GA Lbr Len/2nd Weight Sex Type Anes PTL Lv  2 Current           1 Para    2325 g M CS-Unspec      Past Surgical History:  Procedure Laterality Date   CESAREAN SECTION  02/14/2016   colposopy     Family History  Problem Relation Age of Onset   Alcoholism Father    Diabetes Maternal Grandmother    Cancer Neg Hx    Hypertension Neg Hx    Stroke Neg Hx    Social History   Tobacco Use   Smoking status: Never    Passive exposure: Never   Smokeless tobacco: Never  Vaping Use   Vaping status: Never Used  Substance Use Topics   Alcohol use: Yes    Alcohol/week: 1.0 standard drink of alcohol    Types: 1 Glasses of wine per week   Drug use: No   Allergies  Allergen Reactions   Diflucan  [Fluconazole ]     Possible allergic reaction with eye swelling only    No medications prior to admission.    I have reviewed patient's Past Medical Hx, Surgical Hx, Family Hx, Social Hx, medications and allergies.   ROS  Pertinent items noted in HPI and remainder of comprehensive ROS otherwise negative.   PHYSICAL EXAM  Patient Vitals for the  past 24 hrs:  BP Temp Temp src Pulse Resp SpO2  05/17/24 1532 121/74 98.6 F (37 C) Oral 76 16 100 %    Constitutional: Well-developed, well-nourished female in no acute distress.  HEENT: atraumatic, normocephalic. Neck has normal ROM. EOM intact. Cardiovascular: normal rate & rhythm, warm and well-perfused Respiratory: normal effort, no problems with respiration noted GI: Abd soft, non-tender, non-distended MSK: Extremities nontender, no edema, normal ROM Skin: warm and dry. Acyanotic, no jaundice or pallor. Neurologic: Alert and oriented x 4. No abnormal coordination. Psychiatric: Normal mood. Speech not slurred, not rapid/pressured. Patient is cooperative. GU: no CVA tenderness Pelvic: deferred  Labs: Results for orders placed or performed during the hospital encounter of 05/17/24 (from the past 24 hours)  Pregnancy, urine POC     Status: Abnormal   Collection Time: 05/17/24  3:15 PM  Result Value Ref Range   Preg Test, Ur POSITIVE (A) NEGATIVE  Urinalysis, Routine w reflex microscopic -Urine, Clean Catch     Status: Abnormal   Collection Time: 05/17/24  3:24 PM  Result Value Ref Range   Color, Urine  YELLOW YELLOW   APPearance HAZY (A) CLEAR   Specific Gravity, Urine 1.011 1.005 - 1.030   pH 7.0 5.0 - 8.0   Glucose, UA NEGATIVE NEGATIVE mg/dL   Hgb urine dipstick NEGATIVE NEGATIVE   Bilirubin Urine NEGATIVE NEGATIVE   Ketones, ur NEGATIVE NEGATIVE mg/dL   Protein, ur NEGATIVE NEGATIVE mg/dL   Nitrite NEGATIVE NEGATIVE   Leukocytes,Ua NEGATIVE NEGATIVE  CBC     Status: Abnormal   Collection Time: 05/17/24  4:22 PM  Result Value Ref Range   WBC 7.8 4.0 - 10.5 K/uL   RBC 3.73 (L) 3.87 - 5.11 MIL/uL   Hemoglobin 10.6 (L) 12.0 - 15.0 g/dL   HCT 66.7 (L) 63.9 - 53.9 %   MCV 89.0 80.0 - 100.0 fL   MCH 28.4 26.0 - 34.0 pg   MCHC 31.9 30.0 - 36.0 g/dL   RDW 85.2 88.4 - 84.4 %   Platelets 302 150 - 400 K/uL   nRBC 0.0 0.0 - 0.2 %  ABO/Rh     Status: None   Collection  Time: 05/17/24  4:22 PM  Result Value Ref Range   ABO/RH(D)      O POS Performed at [facility], [address], [place], [place] [address]   Wet prep, genital     Status: None   Collection Time: 05/17/24  4:36 PM  Result Value Ref Range   Yeast Wet Prep HPF POC NONE SEEN NONE SEEN   Trich, Wet Prep NONE SEEN NONE SEEN   Clue Cells Wet Prep HPF POC NONE SEEN NONE SEEN   WBC, Wet Prep HPF POC <10 <10   Sperm NONE SEEN     Imaging:  US  OB Comp Less 14 Wks Result Date: 05/17/2024 CLINICAL DATA:  Spotting during pregnancy. LMP: 03/27/2024 corresponding to an estimated gestational age of [redacted] weeks 1 2 days. EXAM: TWIN OBSTETRICAL ULTRASOUND <14 WKS TECHNIQUE: Transabdominal ultrasound was performed for evaluation of the gestation as well as the maternal uterus and adnexal regions. COMPARISON:  None Available. FINDINGS: Number of IUPs:  2 Chorionicity/Amnionicity:  Dichorionic-diamniotic (thick membrane) TWIN 1 Yolk sac:  Seen Embryo:  Present Cardiac Activity: Detected Heart Rate: 159 bpm CRL:   13 mm   7 w 4 d                  US  EDC: 12/30/2024 TWIN 2 Yolk sac:  Seen Embryo:  Present Cardiac Activity: Detected Heart Rate: 148 bpm CRL:   14 mm   7 w 5 d                  US  EDC: 12/29/2024 Subchorionic hemorrhage: Small subchorionic hemorrhage measures up to 2 cm in length. Maternal uterus/adnexae: Several uterine fibroids including a 5.0 x 4.9 x 5.9 cm fundal a 5.8 x 4.6 x 5.4 cm posterior and a 2.6 x 2.4 x 2.8 cm anterior uterine fibroid. The maternal ovaries are not visualized. IMPRESSION: 1. Twin live intrauterine pregnancy as above. 2. Multiple uterine fibroids. Electronically Signed   By: Vanetta Chou M.D.   On: 05/17/2024 17:51   US  OB Comp AddL Gest Less 14 Wks Result Date: 05/17/2024 CLINICAL DATA:  Spotting during pregnancy. LMP: 03/27/2024 corresponding to an estimated gestational age of [redacted] weeks 1 2 days. EXAM: TWIN OBSTETRICAL ULTRASOUND <14 WKS TECHNIQUE: Transabdominal ultrasound  was performed for evaluation of the gestation as well as the maternal uterus and adnexal regions. COMPARISON:  None Available. FINDINGS: Number of IUPs:  2 Chorionicity/Amnionicity:  Dichorionic-diamniotic (thick membrane) TWIN 1 Yolk sac:  Seen Embryo:  Present Cardiac Activity: Detected Heart Rate: 159 bpm CRL:   13 mm   7 w 4 d                  US  EDC: 12/30/2024 TWIN 2 Yolk sac:  Seen Embryo:  Present Cardiac Activity: Detected Heart Rate: 148 bpm CRL:   14 mm   7 w 5 d                  US  EDC: 12/29/2024 Subchorionic hemorrhage: Small subchorionic hemorrhage measures up to 2 cm in length. Maternal uterus/adnexae: Several uterine fibroids including a 5.0 x 4.9 x 5.9 cm fundal a 5.8 x 4.6 x 5.4 cm posterior and a 2.6 x 2.4 x 2.8 cm anterior uterine fibroid. The maternal ovaries are not visualized. IMPRESSION: 1. Twin live intrauterine pregnancy as above. 2. Multiple uterine fibroids. Electronically Signed   By: Vanetta Chou M.D.   On: 05/17/2024 17:51   MDM & MAU COURSE  MDM: High  MAU Course: -Vital signs within normal limits.  -Ectopic rule out: CBC, US , beta-hCG, blood type. -Wet prep, UA, and GC/chlamydia to rule out infectious causes.  -Wet prep negative. UA with bacteria, will send for culture. -CBC with anemia, start PO iron  supplement. -US  shows twin pregnancy with cardiac activity for both embryos. Subchorionic hematoma also present. CRL congruent with dating by LMP.  Differential diagnosis considered for 1st trimester vaginal bleeding includes but is not limited to: ectopic pregnancy, complete spontaneous abortion, incomplete abortion, missed abortion, threatened abortion, embryonic/fetal demise, cervical insufficiency, cervical or vaginal disorder   Orders Placed This Encounter  Procedures   Wet prep, genital   US  OB Comp Less 14 Wks   US  OB Comp AddL Gest Less 14 Wks   Urinalysis, Routine w reflex microscopic -Urine, Clean Catch   CBC   hCG, quantitative, pregnancy   RPR    Pregnancy, urine POC   ABO/Rh   Discharge patient   Meds ordered this encounter  Medications   ferrous sulfate  325 (65 FE) MG tablet    Sig: Take 1 tablet (325 mg total) by mouth with breakfast on Monday, Wednesday, and Friday.    Dispense:  30 tablet    Refill:  11    ASSESSMENT   1. Vaginal spotting   2. Anemia, unspecified type   3. [redacted] weeks gestation of pregnancy     PLAN  Discharge home in stable condition with return precautions.  Start prenatal care, provided list of OB providers. Start PO iron  supplement, prescription sent.     Allergies as of 05/17/2024       Reactions   Diflucan  [fluconazole ]    Possible allergic reaction with eye swelling only         Medication List     TAKE these medications    ferrous sulfate  325 (65 FE) MG tablet Take 1 tablet (325 mg total) by mouth with breakfast on Monday, Wednesday, and Friday.   promethazine  25 MG tablet Commonly known as: PHENERGAN  Take 1 tablet (25 mg total) by mouth every 6 (six) hours as needed for nausea or vomiting.        Joesph DELENA Sear, PA

## 2024-05-17 NOTE — MAU Note (Signed)
 Nicole Kaiser is a 38 y.o. at Unknown here in MAU reporting: pink spotting when wiped once this morning 1130. Dx with BV 1.5 weeks ago meds taken to completion. No sex last 48 hours.   LMP: 03/27/24 Onset of complaint: 1130 Pain score: 0/10 Vitals:   05/17/24 1532  BP: 121/74  Pulse: 76  Resp: 16  Temp: 98.6 F (37 C)  SpO2: 100%     FHT: na  Lab orders placed from triage: ua upt

## 2024-05-18 LAB — RPR: RPR Ser Ql: NONREACTIVE

## 2024-05-19 LAB — GC/CHLAMYDIA PROBE AMP (~~LOC~~) NOT AT ARMC
Chlamydia: NEGATIVE
Comment: NEGATIVE
Comment: NORMAL
Neisseria Gonorrhea: NEGATIVE

## 2024-05-23 ENCOUNTER — Other Ambulatory Visit (HOSPITAL_BASED_OUTPATIENT_CLINIC_OR_DEPARTMENT_OTHER): Payer: Self-pay

## 2024-06-05 ENCOUNTER — Encounter

## 2024-06-07 ENCOUNTER — Encounter: Admitting: Family Medicine
